# Patient Record
Sex: Female | Born: 1955 | Race: White | Hispanic: No | Smoking: Former smoker
Health system: Southern US, Community
[De-identification: ages and names within clinical notes are randomized; demographics above are authoritative.]

## PROBLEM LIST (undated history)

## (undated) DIAGNOSIS — E7800 Pure hypercholesterolemia, unspecified: Secondary | ICD-10-CM

## (undated) DIAGNOSIS — E119 Type 2 diabetes mellitus without complications: Secondary | ICD-10-CM

## (undated) DIAGNOSIS — F32A Depression, unspecified: Secondary | ICD-10-CM

## (undated) DIAGNOSIS — K219 Gastro-esophageal reflux disease without esophagitis: Secondary | ICD-10-CM

## (undated) DIAGNOSIS — K589 Irritable bowel syndrome without diarrhea: Secondary | ICD-10-CM

## (undated) DIAGNOSIS — I10 Essential (primary) hypertension: Secondary | ICD-10-CM

## (undated) DIAGNOSIS — R0602 Shortness of breath: Secondary | ICD-10-CM

## (undated) DIAGNOSIS — Z9889 Other specified postprocedural states: Secondary | ICD-10-CM

## (undated) DIAGNOSIS — E039 Hypothyroidism, unspecified: Secondary | ICD-10-CM

## (undated) DIAGNOSIS — G43909 Migraine, unspecified, not intractable, without status migrainosus: Secondary | ICD-10-CM

## (undated) DIAGNOSIS — R197 Diarrhea, unspecified: Secondary | ICD-10-CM

## (undated) DIAGNOSIS — G9332 Myalgic encephalomyelitis/chronic fatigue syndrome: Secondary | ICD-10-CM

## (undated) DIAGNOSIS — K5790 Diverticulosis of intestine, part unspecified, without perforation or abscess without bleeding: Secondary | ICD-10-CM

## (undated) DIAGNOSIS — M797 Fibromyalgia: Secondary | ICD-10-CM

## (undated) DIAGNOSIS — R5382 Chronic fatigue, unspecified: Secondary | ICD-10-CM

## (undated) DIAGNOSIS — D126 Benign neoplasm of colon, unspecified: Secondary | ICD-10-CM

## (undated) HISTORY — PX: ELBOW SURGERY: SHX618

## (undated) HISTORY — PX: CARPAL TUNNEL RELEASE: SHX101

## (undated) HISTORY — PX: TRIGGER FINGER RELEASE: SHX641

## (undated) HISTORY — PX: RHINOPLASTY: SUR1284

## (undated) HISTORY — DX: Gastro-esophageal reflux disease without esophagitis: K21.9

## (undated) HISTORY — DX: Chronic fatigue, unspecified: R53.82

## (undated) HISTORY — DX: Myalgic encephalomyelitis/chronic fatigue syndrome: G93.32

## (undated) HISTORY — DX: Diverticulosis of intestine, part unspecified, without perforation or abscess without bleeding: K57.90

## (undated) HISTORY — DX: Benign neoplasm of colon, unspecified: D12.6

## (undated) HISTORY — DX: Pure hypercholesterolemia, unspecified: E78.00

## (undated) HISTORY — PX: OTHER SURGICAL HISTORY: SHX169

## (undated) HISTORY — PX: HERNIA REPAIR: SHX51

## (undated) HISTORY — DX: Other specified postprocedural states: Z98.890

## (undated) HISTORY — PX: CHOLECYSTECTOMY: SHX55

## (undated) HISTORY — PX: TUBAL LIGATION: SHX77

## (undated) HISTORY — PX: ABDOMINAL HYSTERECTOMY: SHX81

## (undated) HISTORY — DX: Hypothyroidism, unspecified: E03.9

## (undated) HISTORY — DX: Essential (primary) hypertension: I10

## (undated) HISTORY — PX: TONSILLECTOMY: SUR1361

## (undated) HISTORY — DX: Irritable bowel syndrome, unspecified: K58.9

---

## 2007-08-10 ENCOUNTER — Ambulatory Visit: Payer: Self-pay | Admitting: Internal Medicine

## 2007-08-16 ENCOUNTER — Ambulatory Visit: Payer: Self-pay | Admitting: Internal Medicine

## 2007-08-16 ENCOUNTER — Ambulatory Visit (HOSPITAL_COMMUNITY): Admission: RE | Admit: 2007-08-16 | Discharge: 2007-08-16 | Payer: Self-pay | Admitting: Internal Medicine

## 2007-08-16 ENCOUNTER — Encounter: Payer: Self-pay | Admitting: Internal Medicine

## 2007-09-19 ENCOUNTER — Ambulatory Visit: Payer: Self-pay | Admitting: Internal Medicine

## 2008-05-01 DIAGNOSIS — R197 Diarrhea, unspecified: Secondary | ICD-10-CM | POA: Insufficient documentation

## 2008-05-01 DIAGNOSIS — K219 Gastro-esophageal reflux disease without esophagitis: Secondary | ICD-10-CM | POA: Insufficient documentation

## 2008-05-01 DIAGNOSIS — K2970 Gastritis, unspecified, without bleeding: Secondary | ICD-10-CM | POA: Insufficient documentation

## 2008-05-01 DIAGNOSIS — R7309 Other abnormal glucose: Secondary | ICD-10-CM

## 2008-05-01 DIAGNOSIS — R12 Heartburn: Secondary | ICD-10-CM

## 2008-05-01 DIAGNOSIS — E7800 Pure hypercholesterolemia, unspecified: Secondary | ICD-10-CM

## 2008-05-01 DIAGNOSIS — E039 Hypothyroidism, unspecified: Secondary | ICD-10-CM | POA: Insufficient documentation

## 2008-05-01 DIAGNOSIS — K2990 Gastroduodenitis, unspecified, without bleeding: Secondary | ICD-10-CM

## 2008-05-01 DIAGNOSIS — R634 Abnormal weight loss: Secondary | ICD-10-CM

## 2008-05-01 DIAGNOSIS — R5382 Chronic fatigue, unspecified: Secondary | ICD-10-CM

## 2008-05-01 DIAGNOSIS — R630 Anorexia: Secondary | ICD-10-CM | POA: Insufficient documentation

## 2008-05-01 DIAGNOSIS — K589 Irritable bowel syndrome without diarrhea: Secondary | ICD-10-CM

## 2008-05-01 DIAGNOSIS — I10 Essential (primary) hypertension: Secondary | ICD-10-CM | POA: Insufficient documentation

## 2009-02-27 ENCOUNTER — Telehealth (INDEPENDENT_AMBULATORY_CARE_PROVIDER_SITE_OTHER): Payer: Self-pay

## 2010-12-15 NOTE — Assessment & Plan Note (Signed)
Michelle Rich, Michelle Rich                  CHART#:  [id number]   DATE:  09/19/2007                       DOB:  [birth year]   FOLLOWUP:  Diarrhea, weight loss.   LAST SEEN:  08/16/2007 by me for three month history of nonbloody  diarrhea weight loss.  Recent colonoscopy by Dr. Marlene Bast was reportedly  negative.  EGD demonstrated intact Nissen fundoplication.  Otherwise  normal esophagus, stomach.  D1 through D3 biopsies DT, D3 negative for  any mucosa abnormality.  TSH was found to be markedly depressed at this  office (0.009).  Her Synthroid dose has been decreased and she tells me  she is overall feeling much better.  She is up 1/2 pound from  08/11/2007.  Diarrhea has resolved.  Reflux symptoms well controlled on  the Zegerid.  She is very pleased.   CURRENT MEDICATIONS:  See updated list.   ALLERGIES:  Darvocet, codeine, penicillin.   EXAMINATION:  Today she looks well.  Weight 201.  Height 5 feet 6  inches.  Temperature 98.2.  Blood pressure 130/98, pulse 80.  SKIN:  Warm and dry.  ABDOMEN:  Nondistended.  Soft, nontender without appreciable mass or  hepatosplenomegaly.   ASSESSMENT:  Diarrhea, anorexia, weight loss in the setting of excessive  thyroid supplementation.  Now much better with a decreased dose.  Her GI  symptoms have settled down.  Her reflux symptoms continue to be well  controlled on omeprazole.   RECOMMENDATIONS:  Continue omeprazole 20 mg early daily.  One year  prescription refill given to the patient today.  Unless something comes  up, we will plan to see her back in the office in one year.   ADDENDUM:  Giardia antigen testing of small bowel aspirate also came  back negative.      Jonathon Bellows, M.D.  Electronically Signed    RMR/MEDQ  D:  09/19/2007  T:  09/19/2007  Job:  [id number]   cc:   Lia Hopping

## 2010-12-15 NOTE — Op Note (Signed)
NAME:  Michelle Rich, Michelle Rich                 ACCOUNT NO.:  [id number]   MEDICAL RECORD NO.:  [id number]          PATIENT TYPE:  AMB   LOCATION:  DAY                           FACILITY:  [facility]   PHYSICIAN:  R. Roetta Sessions, M.D. DATE OF BIRTH:  1956/03/04   DATE OF PROCEDURE:  08/16/2007  DATE OF DISCHARGE:                               OPERATIVE REPORT   PROCEDURE:  EGD with small bowel biopsy aspirate.   INDICATIONS FOR PROCEDURE:  A 55 year old lady with a nearly 3 month  history of intermittent but seriously progressive nonbloody diarrhea.  She tells me she lost 30 pounds in the past 10 weeks. She has a history  of hypothyroidism and is on supplementation.  She saw Dr. Marlene Bast last  month who performed her colonoscopy and cannot find any abnormalities.  She had a negative set of stool studies.  Apparently the colon was not  biopsied to rule out microscopic colitis nor was the ileum seen by  report. There was no family history of inflammatory bowel disease or  colorectal neoplasia or celiac disease for that matter.  EGD is now  being done to further evaluate her weight loss, intermittent nausea and  diarrhea. This approach has been discussed with the patient at length.  The potential risks, benefits and have been reviewed, questions answered  she is agreeable. Please see the documentation in the medical record.   MONITORING:  O2 saturation, blood pressure, pulse and respirations were  monitored  throughout the entire procedure.   CONSCIOUS SEDATION:  Versed 7 mg IV, Demerol 175 mg IV in divided doses.  Phenergan 25 mg diluted slow IV push in divided doses to augment  conscious sedation.  Cetacaine spray for topical pharyngeal anesthesia.   INSTRUMENT:  Pentax video chip system.   FINDINGS:  Examination of the tubular esophagus revealed no mucosal  abnormalities.  EG junction easily traversed.   STOMACH:  The gastric cavity was emptied and insufflated well with air.  A thorough  examination of the gastric mucosa including a retroflexed  view of the proximal stomach and esophagogastric junction demonstrated  intact wrap and otherwise normal-appearing gastric mucosa.  The pylorus  patent and easily traversed.   Examination of the bulb, second and third portion revealed a normal-  appearing mucosa. Biopsies of D2 and D3 were taken for histologic study  and a small bowel aspirate was taken for Giardia antigen screen. The  patient tolerated the procedure well and was reacted in endoscopy.   IMPRESSION:  1. Normal esophagus.  2. Intact Nissen fundoplication.  3. Normal gastric mucosa.  4. Patent pylorus normal.  5. Normal D1 through D3 status post biopsy D2, D3 and small bowel      aspirate for Giardia antigen testing.   [name] TSH is markedly depressed at 0.009 on thyroid  supplementation.  She may be hyperthyroid to account for diarrhea and  recent weight loss. Her CBC through our office came back entirely  normal.  Her Met 7 looked okay except for a slightly elevated glucose of  112.  Her LFTs were normal.  RECOMMENDATIONS:  1. Will follow up on small biopsy checking for celiac disease and      Giardia antigen testing.  2. Mr. Michelle Rich is urged to urgently followup with Dr. Olena Leatherwood so he can      deal with the depressed TSH.  3. If the small bowel biopsy and aspirate are negative and her GI      symptoms do not correct with intervention of her thyroid      dysfunction,  further evaluation of her diarrhea and weight loss      will be in order. At that point, I suppose occult Crohn's disease,      microscopic colitis as well as other diagnostic possibilities will      need to be contemplated. Further recommendations to follow.      Jonathon Bellows, M.D.  Electronically Signed     RMR/MEDQ  D:  08/16/2007  T:  08/16/2007  Job:  [id number]   cc:   Lia Hopping  Fax: [phone]   Benedetto Coons, MD

## 2010-12-15 NOTE — Consult Note (Signed)
NAME:  [name], [name]                 ACCOUNT NO.:  [id number]   MEDICAL RECORD NO.:  [id number]          PATIENT TYPE:  AMB   LOCATION:  DAY                           FACILITY:  [facility]   PHYSICIAN:  R. Roetta Sessions, M.D. DATE OF BIRTH:  02-24-1956   DATE OF CONSULTATION:  08/11/2007  DATE OF DISCHARGE:                                 CONSULTATION   REASON FOR CONSULTATION:  Multiple GI complaints.   HISTORY OF PRESENT ILLNESS:  [name] is a 55 year old female.  She  tells me she developed diarrhea 2 months ago as well as nausea and  vomiting.  In retrospect, she has had intermittent diarrhea for almost a  year now.  However, last 2 months it has been on a daily basis.  She  states her symptoms began as what she thought was gastroenteritis.  She  denies any abdominal pain with her symptoms.  She has lost 30 pounds in  the last 10 weeks she denies anorexia.  She has had intermittent  hematochezia and noticed small amounts of red blood and watery stool.  Generally, she has a bowel movement every time she eats.  She can have  loose stools anywhere from 3-10 times per day.  She has tried Imodium  which does not seem to help.  She is drinking Gatorade at least four per  day.  She has history of hypothyroidism.  A TSH was last checked 8  months ago.  She has tried Bentyl without any help.  She denies any new  medications.  She has tried Imodium which does not seem to help reduce  either.   She has a colonoscopy by Dr. Gabriel Cirri on July 11, 2007, which was  normal.  She had stool studies on July 11, 2007, including a culture,  WBCs and C. difficile of which were negative.   PAST MEDICAL/SURGICAL HISTORY:  1. Irritable bowel syndrome.  2. Chronic fatigue syndrome.  3. Hypertension.  4. Hypothyroidism.  5. Hypercholesterolemia.  6. Chronic GERD status post Nissen fundoplication in 1998.  7. She had she has recently been evaluated by neurologist for frequent      blackouts.  8.  Complete hysterectomy.  9. Umbilical herniorrhaphy.  10.Carpal tunnel release bilaterally.  11.She has glucose intolerance/borderline diabetic.  12.Right elbow surgery.  13.Tonsillectomy.  14.Tubal ligation.  15.Rhinoplasty.  16.Cholecystectomy.   CURRENT MEDICATIONS:  1. Levothyroxine 112 mcg daily.  2. Ergocalciferol 50,000 mg weekly.  3. Hyzaar 50/25 mg daily.  4. Premarin 1.25 mg daily.  5. Primidone 50 mcg three times a day.  6. Omeprazole 20 mg daily.  7. Zocor 20 mg daily.  8. Zanaflex 20 mg t.i.d.  9. Provigil 200 mg daily.  10.Stavzor 250 mg b.i.d. p.r.n. headaches.  11.Lovaza caps four daily.  12.Vitamin D 2 grams daily.  13.Vitamin B12 2 grams daily.  14.Valium 5 mg t.i.d.  15.Ventolin inhaler p.r.n.   ALLERGIES:  CODEINE AND PENICILLIN.   FAMILY HISTORY:  Positive for maternal grandfather with colon cancer.  No first degree relatives.  Mother age 87 history of hypertension.  Father age  39 has coronary disease, one healthy brother, one deceased  due to accident.   SOCIAL HISTORY:  Michelle Rich is separated.  She lives with her mother.  She has one healthy daughter and one healthy son.  She is disabled.  She  has a 35 pack-year history tobacco use.  Denies alcohol or drug use.   REVIEW OF SYSTEMS:  See HPI.  NEUROLOGICAL:  She has had frequent  blackouts, has poorly had a negative neuro evaluation thus far,  including hospitalization.  Otherwise, negative review of systems.   PHYSICAL EXAMINATION:  VITAL SIGNS:  Weight 200.5 pounds, height 56  inches, temperature 98.2, blood pressure 158/84, pulse 64.  GENERAL:  [name] is a well-developed, well-nourished female in no  acute distress.  HEENT.  Sclerae clear, nonicteric.  Conjunctivae pink.  Oropharynx pink  and moist without lesions.  NECK:  Supple without mass or thyromegaly.  CHEST:  Heart regular rate and rhythm with normal S1-S2 without murmurs,  clicks, rubs or gallops.  LUNGS:  Clear to auscultation  bilaterally.  ABDOMEN:  Positive bowel sounds x4.  No bruits auscultated.  Soft,  nontender, nondistended without palpable mass or hepatosplenomegaly.  No  guarding.  EXTREMITIES:  Without clubbing or edema bilaterally.  SKIN:  Pink, warm, dry without any rash or jaundice.   IMPRESSION:  Michelle Rich is a 55 year old female with multiple  gastrointestinal concerns today.  She has had a 25-month history of daily  diarrhea 3-5 loose stools which began nonbloody.  She has noticed some  red bleeding in her stools since that time she did have a colonoscopy by  Dr. Gabriel Cirri on July 11, 2007, which was completely normal.  Since the  onset of diarrhea.  She has had nausea and a sensation of vomiting but  has not been able to vomit since her Nissen fundoplication in 1998.  She  has had a 30-pound weight loss in the last 10 weeks.  Etiology of her  symptoms are unclear at this time.  Further evaluation of nausea and  weight loss to rule out peptic ulcer disease as far as her diarrhea is  concerned.  It is reassuring she has had a normal colonoscopy.  However,  cannot completely rule out inflammatory bowel disease given the new  onset of intermittent hematochezia.   PLAN:  1. Levsin 0.125 mg one p.o. a.c. and h.s. p.r.n. diarrhea #60 with the      refills.  2. CBC, LFTs, met-7 and TSH.  3. EGD with Dr. Jena Gauss in the near future.  I have discussed      procedures, risks, benefits which include but are not limited to      infection, perforation, drug reaction.  She agrees with this plan      and consent will be obtained.   I would like to thank Dr. Gabriel Cirri for allowing [place] to participate in the  care of Ms. Jennette.      Lorenza Burton, N.P.      Jonathon Bellows, M.D.  Electronically Signed    KJ/MEDQ  D:  08/11/2007  T:  08/11/2007  Job:  [id number]   cc:   Erskine Speed, M.D.  Fax: [phone]

## 2011-02-15 ENCOUNTER — Ambulatory Visit: Payer: Self-pay | Admitting: Gastroenterology

## 2011-02-24 ENCOUNTER — Encounter: Payer: Self-pay | Admitting: Gastroenterology

## 2011-02-24 ENCOUNTER — Ambulatory Visit (INDEPENDENT_AMBULATORY_CARE_PROVIDER_SITE_OTHER): Payer: Medicare Other | Admitting: Gastroenterology

## 2011-02-24 VITALS — BP 141/87 | HR 104 | Temp 98.0°F | Ht 65.0 in | Wt 221.0 lb

## 2011-02-24 DIAGNOSIS — R197 Diarrhea, unspecified: Secondary | ICD-10-CM

## 2011-02-24 NOTE — Progress Notes (Signed)
Primary Care Physician:  ADDIS,DANIEL, DO Primary Gastroenterologist:  Dr. Jena Gauss  Chief Complaint  Patient presents with  . Abdominal Pain    HPI:   [name] is a 55 year old Caucasian female who presents after admission to [facility] for diarrhea and abdominal pain. She last saw our office in 2009. Hx of chronic diarrhea. The last procedure from our office was an endoscopy, which was normal and normal small bowel biopsies. Recently had a colonoscopy June 2012 by Dr. Linna Darner at [facility]. Normal colonoscopy, with biopsies showing focal active colitis. Discharge papers reported presuming microscopic colitis; she was sent home on entocort. Stool cultures with yeast and staph aureus. Given Vanc 500 q 6 for 10 days and mycostatin QID X 10 days and probiotic. Was told to wean off entocort, but she has not. Abx completed. Cdiff negative inpatient. CT with findings of mild thickening at splenic flexure and along descending colon.question of pancreatic insufficiency, old trauma, or hx pancreatitis.    Returns today complaining of posptprandial urgency, regardless of food. Up to 12 stools per day. No melena or brbpr. Complains of diffuse and right-sided abdominal discomfort. Bloated and swollen. Described as a bad "tummy ache". Not relieved after BM. Intermitent. +chills but no fever. +loss of appetite. No NSAIDs.     Admitting Hgb 13.1. Cdiff negative.  As of note, after a lengthy review of chart, has been tested for celiac via blood test and biopsy, both negative. Low TSH in past which was corrected with decrease in loose stools.   Past Medical History  Diagnosis Date  . IBS (irritable bowel syndrome)   . Chronic fatigue syndrome   . Hypertension   . Hypothyroidism   . Hypercholesteremia   . GERD (gastroesophageal reflux disease)     s/p Nissen  . S/P endoscopy 2009    normal, small bowel biopsy negative, giardia negative, celiac negative  . S/P colonoscopy     2007: internal/external  hemorrhoids, otherwise normal by Dr. Murlean Hark. June 2012:normal but biopsy with focal acute colitis, non-specific by Dr. Linna Darner     Past Surgical History  Procedure Date  . 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  Marland Kitchen Umbilical herniorrhapy   . Carpal tunnel release   . Elbow surgery     right  . Tonsillectomy   . Tubal ligation   . Rhinoplasty   . Cholecystectomy   . Steel rods and buttons for abdominal wound   . Trigger finger release   . Nissen     Current Outpatient Prescriptions  Medication Sig Dispense Refill  . ALPRAZolam (XANAX) 0.5 MG tablet Take 0.5 mg by mouth at bedtime as needed.        . BUDESONIDE PO Take 9 mg by mouth.        . Butalbital-APAP-Caffeine (FIORICET PO) Take by mouth. 50/325 mg        . Cyanocobalamin (VITAMIN B 12 PO) Take 2 g by mouth.        . dextroamphetamine (DEXEDRINE SPANSULE) 15 MG 24 hr capsule Take 15 mg by mouth daily.        Marland Kitchen eletriptan (RELPAX) 40 MG tablet One tablet by mouth as needed for migraine headache.  If the headache improves and then returns, dose may be repeated after 2 hours have elapsed since first dose (do not exceed 80 mg per day). may repeat in 2 hours if necessary       . estrogens,  conjugated, (PREMARIN) 1.25 MG tablet Take 1.25 mg by mouth daily.        Marland Kitchen FLUoxetine (PROZAC) 20 MG capsule Take 20 mg by mouth daily.        . Fluticasone-Salmeterol (ADVAIR DISKUS) 250-50 MCG/DOSE AEPB Inhale 1 puff into the lungs every 12 (twelve) hours.        Marland Kitchen levothyroxine (SYNTHROID, LEVOTHROID) 112 MCG tablet Take 112 mcg by mouth daily.        Marland Kitchen omega-3 acid ethyl esters (LOVAZA) 1 G capsule Take 2 g by mouth 2 (two) times daily.        Marland Kitchen omeprazole (PRILOSEC) 20 MG capsule Take 20 mg by mouth daily.        . promethazine (PHENERGAN) 50 MG tablet Take 50 mg by mouth every 6 (six) hours as needed.        . simvastatin (ZOCOR) 20 MG tablet Take 20 mg by mouth at bedtime.        Marland Kitchen  telmisartan-hydrochlorothiazide (MICARDIS HCT) 80-12.5 MG per tablet Take 1 tablet by mouth daily.        . tizanidine (ZANAFLEX) 2 MG capsule Take 2 mg by mouth 3 (three) times daily.        . diazepam (VALIUM) 5 MG tablet Take 5 mg by mouth every 6 (six) hours as needed.        Marland Kitchen losartan-hydrochlorothiazide (HYZAAR) 50-12.5 MG per tablet Take 1 tablet by mouth daily.        . modafinil (PROVIGIL) 200 MG tablet Take 200 mg by mouth daily.        . primidone (MYSOLINE) 50 MG tablet Take 50 mg by mouth 4 (four) times daily.        Marland Kitchen VITAMIN D, CHOLECALCIFEROL, PO Take 2 g by mouth.          Allergies as of 02/24/2011 - Review Complete 02/24/2011  Allergen Reaction Noted  . Amitriptyline  02/24/2011  . Aspirin  02/24/2011  . Codeine    . Morphine and related  02/24/2011  . Penicillins    . Propoxyphene n-acetaminophen    . Sulfa antibiotics  02/24/2011    Family History  Problem Relation Age of Onset  . Colon cancer Maternal Grandfather     deceased age 64  . Ulcerative colitis Father     ? question of UC    History   Social History  . Marital Status: Legally Separated    Spouse Name: N/A    Number of Children: N/A  . Years of Education: N/A   Occupational History  . Not on file.   Social History Main Topics  . Smoking status: Former Smoker -- 1.0 packs/day    Types: Cigarettes  . Smokeless tobacco: Not on file   Comment: quit 3 years ago  . Alcohol Use: No  . Drug Use: No  . Sexually Active: Not on file    Review of Systems: Gen: Denies any fever, chills, fatigue, weight loss, + lack of appetite.  CV: Denies chest pain, heart palpitations, peripheral edema, syncope.  Resp: Denies shortness of breath at rest or with exertion. Denies wheezing or cough.  GI: Denies dysphagia or odynophagia. Denies jaundice, hematemesis, fecal incontinence. GU : Denies urinary burning, urinary frequency, urinary hesitancy MS: Denies joint pain, muscle weakness, cramps, or limitation  of movement.  Derm: Denies rash, itching, dry skin Psych: Denies depression, anxiety, memory loss, and confusion Heme: Denies bruising, bleeding, and enlarged lymph nodes.  Physical Exam: BP  141/87  Pulse 104  Temp(Src) 98 F (36.7 C) (Temporal)  Ht 5\' 5"  (1.651 m)  Wt 221 lb (100.245 kg)  BMI 36.78 kg/m2 General:   Alert and oriented. Pleasant and cooperative. Well-nourished and well-developed.  Head:  Normocephalic and atraumatic. Eyes:  Without icterus, sclera clear and conjunctiva pink.  Ears:  Normal auditory acuity. Nose:  No deformity, discharge,  or lesions. Mouth:  No deformity or lesions, oral mucosa pink.  Neck:  Supple, without mass or thyromegaly. Lungs:  Clear to auscultation bilaterally. No wheezes, rales, or rhonchi. No distress.  Heart:  S1, S2 present without murmurs appreciated.  Abdomen:  +BS, soft,TTP right-sided abdomen. Non-distended. . No HSM noted. No guarding or rebound. No masses appreciated.  Rectal:  Deferred  Msk:  Symmetrical without gross deformities. Normal posture. Pulses:  Normal pulses noted. Extremities:  Without clubbing or edema. Neurologic:  Alert and  oriented x4;  grossly normal neurologically. Skin:  Intact without significant lesions or rashes. Cervical Nodes:  No significant cervical adenopathy. Psych:  Alert and cooperative. Normal mood and affect.

## 2011-02-24 NOTE — Patient Instructions (Addendum)
Please complete stool studies. We will contact you as soon as the results are available.  In the meantime, we will be reviewing the CT films with the radiologist.  Avoid dairy products for now. Add a probiotic.   Taper off the entocort. Start taking 6 mg daily for one week, then 3 mg for one week, then stop.   We will be contacting you once results are returned with the next step.

## 2011-02-27 ENCOUNTER — Encounter: Payer: Self-pay | Admitting: Gastroenterology

## 2011-02-27 ENCOUNTER — Other Ambulatory Visit: Payer: Self-pay | Admitting: Gastroenterology

## 2011-02-27 NOTE — Assessment & Plan Note (Addendum)
56 year old Caucasian female with hx of IBS, chronic diarrhea, work-up in past to include colonoscopy at outside facility as well as EGD by [place] in 2009 with small bowel biopsy. Recently discharged from [facility] with presumed microscopic colitis, no improvement with entocort. Biopsies from most recent colonoscopy June 2012 returned non-specific focal active colitis, staph aureus in culture and large amount of yeast. Abx completed. Still on entocort. No melena or brbpr. Cdiff negative. CT with question of pancreatic insufficiency, old trauma, or hx pancreatitis. Pt denies any pancreatic issues in past. +right-sided and diffuse abdominal discomfort, bloating, unrelieved by BM. At times up to 12 stools per day. Question of underlying IBD brewing, infectious etiology, ?pancreatic insufficiency. Pt may very well need a colonoscopy or at very least a flex sig through our own office, as per records we have not personally performed one here. Will proceed with stool studies, review CT with radiologist, taper off entocort, and decide on possible colonoscopy or other imaging studies if above negative.  Taper off entocort. Cdiff PCR, stool culture, quantitative fecal fat  TSH  Probiotic Further recommendations to follow

## 2011-03-01 ENCOUNTER — Other Ambulatory Visit: Payer: Self-pay | Admitting: Gastroenterology

## 2011-03-01 NOTE — Progress Notes (Signed)
Cc to PCP 

## 2011-03-03 ENCOUNTER — Other Ambulatory Visit: Payer: Self-pay | Admitting: Gastroenterology

## 2011-03-03 DIAGNOSIS — R197 Diarrhea, unspecified: Secondary | ICD-10-CM

## 2011-03-04 NOTE — Progress Notes (Signed)
Quick Note:  Negative. Waiting on further stool studies. Can we obtain PR from pt. ______

## 2011-03-05 LAB — STOOL CULTURE

## 2011-03-23 ENCOUNTER — Telehealth: Payer: Self-pay | Admitting: Gastroenterology

## 2011-03-23 MED ORDER — PANTOPRAZOLE SODIUM 40 MG PO TBEC: 40.0000 mg | DELAYED_RELEASE_TABLET | Freq: Every day | ORAL | Status: DC

## 2011-03-23 MED ORDER — PANCRELIPASE (LIP-PROT-AMYL) 24000-76000 UNITS PO CPEP: 2.0000 | ORAL_CAPSULE | Freq: Three times a day (TID) | ORAL | Status: DC

## 2011-03-23 NOTE — Telephone Encounter (Signed)
Pt called and was informed. Rx's were sent to [facility] in [place].

## 2011-03-23 NOTE — Telephone Encounter (Signed)
Please inform pt we have received results from lab regarding fecal fat. This shows a large amount in her stool. At this point, she has not shown improvement with entocort (prescribed by Dr. Linna Darner in June), and her CT showed findings of possible pancreatic insufficiency (at this point unknown etiology).  Let's trial Creon with her. Start with 2 tablets prior to meals, 1 prior to snacks, return in 1 month. Switch from omeprazole to  Protonix 40 mg po daily.   Please ask her if she is having foul smelling stools, slimy/greasy/floating.  We will see her back in 4 weeks.

## 2011-03-24 MED ORDER — PANCRELIPASE (LIP-PROT-AMYL) 25000 UNITS PO CPEP: 2.0000 | ORAL_CAPSULE | Freq: Three times a day (TID) | ORAL | Status: DC

## 2011-03-24 NOTE — Telephone Encounter (Signed)
Creon not covered by insurance. We will switch to Zenpep. Please inform pt this has been sent to her pharmacy.

## 2011-03-24 NOTE — Telephone Encounter (Signed)
Pt aware.

## 2011-03-30 ENCOUNTER — Encounter: Payer: Self-pay | Admitting: Internal Medicine

## 2011-03-30 NOTE — Telephone Encounter (Signed)
Pt is aware of OV on 04/29/11 at 2 pm with AS

## 2011-04-22 ENCOUNTER — Encounter: Payer: Self-pay | Admitting: Gastroenterology

## 2011-04-22 ENCOUNTER — Ambulatory Visit (INDEPENDENT_AMBULATORY_CARE_PROVIDER_SITE_OTHER): Payer: Medicare Other | Admitting: Gastroenterology

## 2011-04-22 DIAGNOSIS — R109 Unspecified abdominal pain: Secondary | ICD-10-CM

## 2011-04-22 DIAGNOSIS — R197 Diarrhea, unspecified: Secondary | ICD-10-CM

## 2011-04-22 LAB — LIPASE: Lipase: 19 U/L (ref 0–75)

## 2011-04-22 LAB — GIARDIA/CRYPTOSPORIDIUM SCREEN(EIA): Giardia Screen - EIA: NEGATIVE

## 2011-04-22 LAB — CBC WITH DIFFERENTIAL/PLATELET
Basophils Relative: 0 % (ref 0–1)
Eosinophils Absolute: 0.1 10*3/uL (ref 0.0–0.7)
Eosinophils Relative: 1 % (ref 0–5)
Hemoglobin: 12.6 g/dL (ref 12.0–15.0)
Lymphs Abs: 4.5 10*3/uL — ABNORMAL HIGH (ref 0.7–4.0)
MCH: 28.7 pg (ref 26.0–34.0)
MCHC: 33.1 g/dL (ref 30.0–36.0)
MCV: 86.8 fL (ref 78.0–100.0)
Monocytes Relative: 6 % (ref 3–12)
RBC: 4.39 MIL/uL (ref 3.87–5.11)

## 2011-04-22 LAB — COMPREHENSIVE METABOLIC PANEL
Alkaline Phosphatase: 83 U/L (ref 39–117)
BUN: 15 mg/dL (ref 6–23)
CO2: 26 mEq/L (ref 19–32)
Creat: 1.24 mg/dL — ABNORMAL HIGH (ref 0.50–1.10)
Glucose, Bld: 110 mg/dL — ABNORMAL HIGH (ref 70–99)
Total Bilirubin: 0.3 mg/dL (ref 0.3–1.2)

## 2011-04-22 MED ORDER — PROMETHAZINE HCL 50 MG PO TABS: 25.0000 mg | ORAL_TABLET | Freq: Four times a day (QID) | ORAL | Status: DC | PRN

## 2011-04-22 MED ORDER — PANCRELIPASE (LIP-PROT-AMYL) 25000 UNITS PO CPEP: 3.0000 | ORAL_CAPSULE | Freq: Three times a day (TID) | ORAL | Status: DC

## 2011-04-22 NOTE — Patient Instructions (Signed)
Start taking Aciphex by mouth each morning. Samples have been provided.  Please increase your enzymes to 3 pills with each meal, 1 with snacks. There is room to increase this if needed. Please call our office if you do not notice improvement.  We have set you up for an upper endoscopy with Dr. Jena Gauss in the near future.  Please have blood work drawn; we will call you with these results.

## 2011-04-22 NOTE — Progress Notes (Signed)
Referring Provider: Renaldo Harrison, DO Primary Care Physician:  ADDIS,DANIEL, DO  Chief Complaint  Patient presents with  . Follow-up    meds not working    HPI:   Michelle Rich is a 55 year old Caucasian female who presents in follow-up for chronic diarrhea. She was admitted in June 2012 to [facility] for diarrhea and abdominal pain. The last procedure from our office was an endoscopy, which was normal and normal small bowel biopsies. Recently had a colonoscopy June 2012 by Dr. Linna Darner at [place]. Normal colonoscopy, with biopsies showing focal active colitis. Discharge papers reported presuming microscopic colitis; she was sent home on entocort but was supposed to wean off as microscopic colitis actually not found,. Stool cultures with yeast and staph aureus. Given Vanc 500 q 6 for 10 days and mycostatin QID X 10 days and probiotic. Cdiff negative inpatient. CT with findings of mild thickening at splenic flexure and along descending colon.question of pancreatic insufficiency, old trauma, or hx pancreatitis.  In interim from last visit, completed stool studies and fecal fat analysis. Stool studies negative, fecal fat with elevated at 27 (normal less than 7). Started on Zenpep, low-dose as trial.   Now states every time she eats, she goes to bathroom. Ate a sandwich yesterday, went 4 times afterward. Greasy/slimy/floating. Intermittent brbpr. Tries to burp but can't, slimy taste in mouth, right back pain (scapular area), and right-sided abdomen/diffused abdominal soreness. Described as constant. No relief with BMs. +Nausea, no GERD. Feels "fizzy". Takes Protonix daily. No early satiety. States she is hungry. Up 10 lbs from last visit. Taking a probiotic.   Review of chart at last visit showed she has been tested for celiac via blood test and biopsy in past, both negative. Low TSH in past, corrected with decrease in loose stools. This is the only lab not completed thus far.    Past Medical History    Diagnosis Date  . IBS (irritable bowel syndrome)   . Chronic fatigue syndrome   . Hypertension   . Hypothyroidism   . Hypercholesteremia   . GERD (gastroesophageal reflux disease)     s/p Nissen  . S/P endoscopy 2009    normal, small bowel biopsy negative, giardia negative, celiac negative  . S/P colonoscopy     2007: internal/external hemorrhoids, otherwise normal by Dr. Murlean Hark. June 2012:normal but biopsy with focal acute colitis, non-specific by Dr. Linna Darner     Past Surgical History  Procedure Date  . 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  Marland Kitchen Umbilical herniorrhapy   . Carpal tunnel release   . Elbow surgery     right  . Tonsillectomy   . Tubal ligation   . Rhinoplasty   . Cholecystectomy   . Steel rods and buttons for abdominal wound   . Trigger finger release   . Nissen     Current Outpatient Prescriptions  Medication Sig Dispense Refill  . ALPRAZolam (XANAX) 0.5 MG tablet Take 0.5 mg by mouth at bedtime as needed.        . Butalbital-APAP-Caffeine (FIORICET PO) Take by mouth. 50/325 mg        . Cyanocobalamin (VITAMIN B 12 PO) Take 2 g by mouth.        . dextroamphetamine (DEXEDRINE SPANSULE) 15 MG 24 hr capsule Take 15 mg by mouth daily.        . diazepam (VALIUM) 5 MG tablet Take 5 mg by mouth every 6 (six) hours  as needed.        . eletriptan (RELPAX) 40 MG tablet One tablet by mouth as needed for migraine headache.  If the headache improves and then returns, dose may be repeated after 2 hours have elapsed since first dose (do not exceed 80 mg per day). may repeat in 2 hours if necessary       . estrogens, conjugated, (PREMARIN) 1.25 MG tablet Take 1.25 mg by mouth daily.        Marland Kitchen FLUoxetine (PROZAC) 20 MG capsule Take 20 mg by mouth daily.        . Fluticasone-Salmeterol (ADVAIR DISKUS) 250-50 MCG/DOSE AEPB Inhale 1 puff into the lungs every 12 (twelve) hours.        Marland Kitchen levothyroxine (SYNTHROID, LEVOTHROID) 112  MCG tablet Take 112 mcg by mouth daily.        Marland Kitchen losartan-hydrochlorothiazide (HYZAAR) 50-12.5 MG per tablet Take 1 tablet by mouth daily.        . modafinil (PROVIGIL) 200 MG tablet Take 200 mg by mouth daily.        Marland Kitchen omega-3 acid ethyl esters (LOVAZA) 1 G capsule Take 2 g by mouth 2 (two) times daily.        . Pancrelipase, Lip-Prot-Amyl, (ZENPEP) 25000 UNITS CPEP Take 3 capsules by mouth 3 (three) times daily with meals. 1 with snacks.  330 capsule  3  . pantoprazole (PROTONIX) 40 MG tablet Take 1 tablet (40 mg total) by mouth daily.  31 tablet  1  . primidone (MYSOLINE) 50 MG tablet Take 50 mg by mouth 4 (four) times daily.        . promethazine (PHENERGAN) 50 MG tablet Take 0.5 tablets (25 mg total) by mouth every 6 (six) hours as needed.  31 tablet  0  . simvastatin (ZOCOR) 20 MG tablet Take 20 mg by mouth at bedtime.        Marland Kitchen telmisartan-hydrochlorothiazide (MICARDIS HCT) 80-12.5 MG per tablet Take 1 tablet by mouth daily.        . tizanidine (ZANAFLEX) 2 MG capsule Take 2 mg by mouth 3 (three) times daily.        Marland Kitchen VITAMIN D, CHOLECALCIFEROL, PO Take 2 g by mouth.          Allergies as of 04/22/2011 - Review Complete 04/22/2011  Allergen Reaction Noted  . Amitriptyline  02/24/2011  . Aspirin  02/24/2011  . Codeine    . Morphine and related  02/24/2011  . Penicillins    . Propoxyphene n-acetaminophen    . Sulfa antibiotics  02/24/2011    Family History  Problem Relation Age of Onset  . Colon cancer Maternal Grandfather     deceased age 69  . Ulcerative colitis Father     ? question of UC    History   Social History  . Marital Status: Legally Separated    Spouse Name: N/A    Number of Children: N/A  . Years of Education: N/A   Social History Main Topics  . Smoking status: Former Smoker -- 1.0 packs/day    Types: Cigarettes  . Smokeless tobacco: None   Comment: quit 3 years ago  . Alcohol Use: No  . Drug Use: No  . Sexually Active: None   Other Topics Concern   . None   Social History Narrative  . None    Review of Systems: Gen: Denies fever, chills, anorexia. Denies fatigue, weakness, weight loss.  CV: Denies chest pain, palpitations, syncope, peripheral edema,  and claudication. Resp: Denies dyspnea at rest, cough, wheezing, coughing up blood, and pleurisy. GI: Denies vomiting blood, jaundice, and fecal incontinence.   Denies dysphagia or odynophagia. Derm: Denies rash, itching, dry skin Psych: Denies depression, anxiety, memory loss, confusion. No homicidal or suicidal ideation.  Heme: Denies bruising, bleeding, and enlarged lymph nodes.  Physical Exam: BP 159/99  Pulse 120  Temp(Src) 98.6 F (37 C) (Temporal)  Ht 5\' 5"  (1.651 m)  Wt 231 lb 12.8 oz (105.144 kg)  BMI 38.57 kg/m2 General:   Alert and oriented. No distress noted. Pleasant and cooperative.  Head:  Normocephalic and atraumatic. Eyes:  Conjuctiva clear without scleral icterus. Mouth:  Oral mucosa pink and moist. Good dentition. No lesions. Neck:  Supple, without mass or thyromegaly. Heart:  S1, S2 present without murmurs, rubs, or gallops. Regular rate and rhythm. Abdomen:  +BS, soft,largely obese,  non-tender and non-distended. No rebound or guarding. No HSM or masses noted. Msk:  Symmetrical without gross deformities. Normal posture. Extremities:  Without edema. Neurologic:  Alert and  oriented x4;  grossly normal neurologically. Skin:  Intact without significant lesions or rashes. Cervical Nodes:  No significant cervical adenopathy. Psych:  Alert and cooperative. Normal mood and affect.

## 2011-04-25 DIAGNOSIS — R109 Unspecified abdominal pain: Secondary | ICD-10-CM | POA: Insufficient documentation

## 2011-04-25 NOTE — Assessment & Plan Note (Addendum)
55 year old Caucasian female with right-sided/diffuse abdominal "soreness", with no precipitation or alleviating factors. +Nausea, feels like needs to throw up but can't. Interestingly, she is up 10 lbs from the last visit. S/p chole. On Zenpep now due to high fecal fat analysis. Greasy, loose stools, multiple times per day, especially after eating. Negative biopsy for celiac sprue in past, negative blood test. Labs thus far have been unrevealing. Complains of foul, slimy taste in mouth. Last EGD in 2009. Due to nausea, abdominal pain, will proceed with EGD with Dr. Jena Gauss. Doubt dealing with hepatobiliary etiology: labs thus far have been unrevealing.   Proceed with upper endoscopy in the near future with Dr. Jena Gauss. The risks, benefits, and alternatives have been discussed in detail with patient. They have stated understanding and desire to proceed.  Switch to Aciphex, samples provided #14 Obtain TSH (due to chronic diarrhea) Increase Zenpep to 3 capsules per meal, 1 per snack Consider Questran in future, flex sig if necessary?

## 2011-04-26 NOTE — Progress Notes (Signed)
Cc to PCP 

## 2011-04-26 NOTE — Assessment & Plan Note (Signed)
Hx of chronic diarrhea, with thorough work-up in past. Stool studies negative thus far, colonoscopy negative for microscopic colitis in June 2012 with Dr. Linna Darner. Interestingly enough, she has gained 10 lbs since the last visit. Fecal fat analysis performed, large quantity of fecal fat, started on Zenpep empirically (?pancreatic insufficiency on CT from [facility]). Will increase Zenpep to 3 capsules with meals, one with snacks. This is on the lower end of dosing actually. ?bile salt diarrhea? If no improvement, may need to consider flex sig, questran?. However, await TSH results.

## 2011-04-29 ENCOUNTER — Ambulatory Visit: Payer: Medicare Other | Admitting: Gastroenterology

## 2011-04-29 NOTE — Progress Notes (Signed)
Results Cc to PCP  

## 2011-05-10 MED ORDER — SODIUM CHLORIDE 0.45 % IV SOLN
Freq: Once | INTRAVENOUS | Status: AC
  Administered 2011-05-11: 11:00:00 via INTRAVENOUS

## 2011-05-11 ENCOUNTER — Ambulatory Visit (HOSPITAL_COMMUNITY)
Admission: RE | Admit: 2011-05-11 | Discharge: 2011-05-11 | Disposition: A | Discharge status: Home / Self Care | Payer: Medicare Other | Source: Ambulatory Visit | Attending: Internal Medicine | Admitting: Internal Medicine

## 2011-05-11 ENCOUNTER — Other Ambulatory Visit: Payer: Self-pay | Admitting: Internal Medicine

## 2011-05-11 ENCOUNTER — Encounter (HOSPITAL_COMMUNITY)
Admission: RE | Disposition: A | Discharge status: Home / Self Care | Payer: Self-pay | Source: Ambulatory Visit | Attending: Internal Medicine

## 2011-05-11 ENCOUNTER — Encounter (HOSPITAL_COMMUNITY): Payer: Self-pay | Admitting: *Deleted

## 2011-05-11 DIAGNOSIS — K2100 Gastro-esophageal reflux disease with esophagitis, without bleeding: Secondary | ICD-10-CM | POA: Insufficient documentation

## 2011-05-11 DIAGNOSIS — R109 Unspecified abdominal pain: Secondary | ICD-10-CM

## 2011-05-11 DIAGNOSIS — R197 Diarrhea, unspecified: Secondary | ICD-10-CM | POA: Insufficient documentation

## 2011-05-11 DIAGNOSIS — Z79899 Other long term (current) drug therapy: Secondary | ICD-10-CM | POA: Insufficient documentation

## 2011-05-11 DIAGNOSIS — I10 Essential (primary) hypertension: Secondary | ICD-10-CM | POA: Insufficient documentation

## 2011-05-11 HISTORY — PX: ESOPHAGOGASTRODUODENOSCOPY: SHX5428

## 2011-05-11 HISTORY — DX: Diarrhea, unspecified: R19.7

## 2011-05-11 HISTORY — DX: Migraine, unspecified, not intractable, without status migrainosus: G43.909

## 2011-05-11 HISTORY — DX: Fibromyalgia: M79.7

## 2011-05-11 HISTORY — DX: Shortness of breath: R06.02

## 2011-05-11 SURGERY — EGD (ESOPHAGOGASTRODUODENOSCOPY)
Anesthesia: Moderate Sedation

## 2011-05-11 MED ORDER — PANTOPRAZOLE SODIUM 40 MG PO TBEC: 40.0000 mg | DELAYED_RELEASE_TABLET | Freq: Two times a day (BID) | ORAL | Status: DC

## 2011-05-11 MED ORDER — MIDAZOLAM HCL 5 MG/5ML IJ SOLN
INTRAMUSCULAR | Status: DC | PRN
  Administered 2011-05-11 (×4): 2 mg via INTRAVENOUS

## 2011-05-11 MED ORDER — MIDAZOLAM HCL 5 MG/5ML IJ SOLN
INTRAMUSCULAR | Status: AC
  Filled 2011-05-11: qty 10

## 2011-05-11 MED ORDER — BUTAMBEN-TETRACAINE-BENZOCAINE 2-2-14 % EX AERO
INHALATION_SPRAY | CUTANEOUS | Status: DC | PRN
  Administered 2011-05-11: 2 via TOPICAL

## 2011-05-11 MED ORDER — MEPERIDINE HCL 100 MG/ML IJ SOLN
INTRAMUSCULAR | Status: AC
  Filled 2011-05-11: qty 2

## 2011-05-11 MED ORDER — MEPERIDINE HCL 100 MG/ML IJ SOLN
INTRAMUSCULAR | Status: DC | PRN
  Administered 2011-05-11: 25 mg via INTRAVENOUS
  Administered 2011-05-11 (×3): 50 mg via INTRAVENOUS

## 2011-05-11 NOTE — H&P (Signed)
Michelle Halls, NP  04/26/2011 12:02 AM  Signed   Referring Provider: Renaldo Harrison, DO Primary Care Physician:  Rich,DANIEL, DO    Chief Complaint   Patient presents with   .  Follow-up       meds not working      HPI:    [name] is a 55 year old Caucasian female who presents in follow-up for chronic diarrhea. She was admitted in June 2012 to [facility] for diarrhea and abdominal pain. The last procedure from our office was an endoscopy, which was normal and normal small bowel biopsies. Recently had a colonoscopy June 2012 by Dr. Linna Rich at [place]. Normal colonoscopy, with biopsies showing focal active colitis. Discharge papers reported presuming microscopic colitis; she was sent home on entocort but was supposed to wean off as microscopic colitis actually not found,. Stool cultures with yeast and staph aureus. Given Vanc 500 q 6 for 10 days and mycostatin QID X 10 days and probiotic. Cdiff negative inpatient. CT with findings of mild thickening at splenic flexure and along descending colon.question of pancreatic insufficiency, old trauma, or hx pancreatitis.   In interim from last visit, completed stool studies and fecal fat analysis. Stool studies negative, fecal fat with elevated at 27 (normal less than 7). Started on Zenpep, low-dose as trial.    Now states every time she eats, she goes to bathroom. Ate a sandwich yesterday, went 4 times afterward. Greasy/slimy/floating. Intermittent brbpr. Tries to burp but can't, slimy taste in mouth, right back pain (scapular area), and right-sided abdomen/diffused abdominal soreness. Described as constant. No relief with BMs. +Nausea, no GERD. Feels "fizzy". Takes Protonix daily. No early satiety. States she is hungry. Up 10 lbs from last visit. Taking a probiotic.    Review of chart at last visit showed she has been tested for celiac via blood test and biopsy in past, both negative. Low TSH in past, corrected with decrease in loose stools. This is the  only lab not completed thus far.       Past Medical History   Diagnosis  Date   .  IBS (irritable bowel syndrome)     .  Chronic fatigue syndrome     .  Hypertension     .  Hypothyroidism     .  Hypercholesteremia     .  GERD (gastroesophageal reflux disease)         s/p Nissen   .  S/P endoscopy  2009       normal, small bowel biopsy negative, giardia negative, celiac negative   .  S/P colonoscopy         2007: internal/external hemorrhoids, otherwise normal by Dr. Murlean Hark. June 2012:normal but biopsy with focal acute colitis, non-specific by Dr. Linna Rich        Past Surgical History   Procedure  Date   .  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   Marland Kitchen  Umbilical herniorrhapy     .  Carpal tunnel release     .  Elbow surgery         right   .  Tonsillectomy     .  Tubal ligation     .  Rhinoplasty     .  Cholecystectomy     .  Steel rods and buttons for abdominal wound     .  Trigger finger release     .  Nissen  Current Outpatient Prescriptions   Medication  Sig  Dispense  Refill   .  ALPRAZolam (XANAX) 0.5 MG tablet  Take 0.5 mg by mouth at bedtime as needed.           .  Butalbital-APAP-Caffeine (FIORICET PO)  Take by mouth. 50/325 mg            .  Cyanocobalamin (VITAMIN B 12 PO)  Take 2 g by mouth.           .  dextroamphetamine (DEXEDRINE SPANSULE) 15 MG 24 hr capsule  Take 15 mg by mouth daily.           .  diazepam (VALIUM) 5 MG tablet  Take 5 mg by mouth every 6 (six) hours as needed.           .  eletriptan (RELPAX) 40 MG tablet  One tablet by mouth as needed for migraine headache.  If the headache improves and then returns, dose may be repeated after 2 hours have elapsed since first dose (do not exceed 80 mg per day). may repeat in 2 hours if necessary          .  estrogens, conjugated, (PREMARIN) 1.25 MG tablet  Take 1.25 mg by mouth daily.           Marland Kitchen  FLUoxetine (PROZAC) 20 MG capsule  Take 20 mg by  mouth daily.           .  Fluticasone-Salmeterol (ADVAIR DISKUS) 250-50 MCG/DOSE AEPB  Inhale 1 puff into the lungs every 12 (twelve) hours.           Marland Kitchen  levothyroxine (SYNTHROID, LEVOTHROID) 112 MCG tablet  Take 112 mcg by mouth daily.           Marland Kitchen  losartan-hydrochlorothiazide (HYZAAR) 50-12.5 MG per tablet  Take 1 tablet by mouth daily.           .  modafinil (PROVIGIL) 200 MG tablet  Take 200 mg by mouth daily.           Marland Kitchen  omega-3 acid ethyl esters (LOVAZA) 1 G capsule  Take 2 g by mouth 2 (two) times daily.           .  Pancrelipase, Lip-Prot-Amyl, (ZENPEP) 25000 UNITS CPEP  Take 3 capsules by mouth 3 (three) times daily with meals. 1 with snacks.   330 capsule   3   .  pantoprazole (PROTONIX) 40 MG tablet  Take 1 tablet (40 mg total) by mouth daily.   31 tablet   1   .  primidone (MYSOLINE) 50 MG tablet  Take 50 mg by mouth 4 (four) times daily.           .  promethazine (PHENERGAN) 50 MG tablet  Take 0.5 tablets (25 mg total) by mouth every 6 (six) hours as needed.   31 tablet   0   .  simvastatin (ZOCOR) 20 MG tablet  Take 20 mg by mouth at bedtime.           Marland Kitchen  telmisartan-hydrochlorothiazide (MICARDIS HCT) 80-12.5 MG per tablet  Take 1 tablet by mouth daily.           .  tizanidine (ZANAFLEX) 2 MG capsule  Take 2 mg by mouth 3 (three) times daily.           Marland Kitchen  VITAMIN D, CHOLECALCIFEROL, PO  Take 2 g by mouth.  Allergies as of 04/22/2011 - Review Complete 04/22/2011   Allergen  Reaction  Noted   .  Amitriptyline    02/24/2011   .  Aspirin    02/24/2011   .  Codeine       .  Morphine and related    02/24/2011   .  Penicillins       .  Propoxyphene n-acetaminophen       .  Sulfa antibiotics    02/24/2011       Family History   Problem  Relation  Age of Onset   .  Colon cancer  Maternal Grandfather         deceased age 98   .  Ulcerative colitis  Father         ? question of UC       History       Social History   .  Marital Status:  Legally Separated        Spouse Name:  N/A       Number of Children:  N/A   .  Years of Education:  N/A       Social History Main Topics   .  Smoking status:  Former Smoker -- 1.0 packs/day       Types:  Cigarettes   .  Smokeless tobacco:  None     Comment: quit 3 years ago   .  Alcohol Use:  No   .  Drug Use:  No   .  Sexually Active:  None       Other Topics  Concern   .  None       Social History Narrative   .  None      Review of Systems: Gen: Denies fever, chills, anorexia. Denies fatigue, weakness, weight loss.   CV: Denies chest pain, palpitations, syncope, peripheral edema, and claudication. Resp: Denies dyspnea at rest, cough, wheezing, coughing up blood, and pleurisy. GI: Denies vomiting blood, jaundice, and fecal incontinence.   Denies dysphagia or odynophagia. Derm: Denies rash, itching, dry skin Psych: Denies depression, anxiety, memory loss, confusion. No homicidal or suicidal ideation.   Heme: Denies bruising, bleeding, and enlarged lymph nodes.   Physical Exam: BP 159/99  Pulse 120  Temp(Src) 98.6 F (37 C) (Temporal)  Ht 5\' 5"  (1.651 m)  Wt 231 lb 12.8 oz (105.144 kg)  BMI 38.57 kg/m2 General:   Alert and oriented. No distress noted. Pleasant and cooperative.   Head:  Normocephalic and atraumatic. Eyes:  Conjuctiva clear without scleral icterus. Mouth:  Oral mucosa pink and moist. Good dentition. No lesions. Neck:  Supple, without mass or thyromegaly. Heart:  S1, S2 present without murmurs, rubs, or gallops. Regular rate and rhythm. Abdomen:  +BS, soft,largely obese,  non-tender and non-distended. No rebound or guarding. No HSM or masses noted. Msk:  Symmetrical without gross deformities. Normal posture. Extremities:  Without edema. Neurologic:  Alert and  oriented x4;  grossly normal neurologically. Skin:  Intact without significant lesions or rashes. Cervical Nodes:  No significant cervical adenopathy. Psych:  Alert and cooperative. Normal mood and affect.     [place] Score  04/26/2011  3:47 PM  Signed Cc to PCP  [place] Score  04/29/2011 12:48 PM  Signed Results Cc to PCP        Abdominal pain - Michelle Halls, NP  04/25/2011 11:52 PM  Addendum 55 year old Caucasian female with right-sided/diffuse abdominal "soreness", with no precipitation or alleviating factors. +  Nausea, feels like needs to throw up but can't. Interestingly, she is up 10 lbs from the last visit. S/p chole. On Zenpep now due to high fecal fat analysis. Greasy, loose stools, multiple times per day, especially after eating. Negative biopsy for celiac sprue in past, negative blood test. Labs thus far have been unrevealing. Complains of foul, slimy taste in mouth. Last EGD in 2009. Due to nausea, abdominal pain, will proceed with EGD with Dr. Jena Gauss. Doubt dealing with hepatobiliary etiology: labs thus far have been unrevealing.    Proceed with upper endoscopy in the near future with Dr. Jena Gauss. The risks, benefits, and alternatives have been discussed in detail with patient. They have stated understanding and desire to proceed.   Switch to Aciphex, samples provided #14 Obtain TSH (due to chronic diarrhea) Increase Zenpep to 3 capsules per meal, 1 per snack Consider Questran in future, flex sig if necessary?  Previous Version  DIARRHEA - Michelle Halls, NP  04/26/2011 12:01 AM  Signed Hx of chronic diarrhea, with thorough work-up in past. Stool studies negative thus far, colonoscopy negative for microscopic colitis in June 2012 with Dr. Linna Rich. Interestingly enough, she has gained 10 lbs since the last visit. Fecal fat analysis performed, large quantity of fecal fat, started on Zenpep empirically (?pancreatic insufficiency on CT from [facility]). Will increase Zenpep to 3 capsules with meals, one with snacks. This is on the lower end of dosing actually. ?bile salt diarrhea? If no improvement, may need to consider flex sig, questran?. However, await TSH results.     I have seen the patient prior to the  procedure(s) today and reviewed the history and physical / consultation from 04/26/11.  There have been no changes. After consideration of the risks, benefits, alternatives and imponderables, the patient has consented to the procedure(s).

## 2011-05-17 ENCOUNTER — Encounter: Payer: Self-pay | Admitting: Gastroenterology

## 2011-05-17 ENCOUNTER — Other Ambulatory Visit: Payer: Self-pay

## 2011-05-17 MED ORDER — PROMETHAZINE HCL 25 MG PO TABS: ORAL_TABLET | ORAL | Status: DC

## 2011-05-17 NOTE — Progress Notes (Unsigned)
  How is pt's diarrhea since increasing Zenpep to 3 capsules per meal?  Need TSH results. I am unsure if she had this done or not.

## 2011-05-19 ENCOUNTER — Encounter (HOSPITAL_COMMUNITY): Payer: Self-pay | Admitting: Internal Medicine

## 2011-05-20 NOTE — Progress Notes (Signed)
Ok. We aren't getting anywhere with symptomatic treatment. Wondering if perhaps she has microscopic colitis or other underlying etiology. She did have a colonoscopy with Dr. Linna Darner in July 2012, showing focal active colitis, and they were concerned about microscopic colitis at that time. Path negative however. She really needs to have another look as her diarrhea is worse. In looking back, we have not actually performed a colonoscopy on her; she has had it done by Dr. Gabriel Cirri and Dr. Linna Darner. At this point, I feel we need to do at the very least a flex sig, if not a complete colonoscopy. I'd like to run this by Dr. Jena Gauss first. Please tell pt we will be proceeding with a look at her colon in some form or fashion in the near future. This will need to be done in the OR due to polypharmacy. I will get input from Dr. Jena Gauss and let you know flex sig vs TCS.

## 2011-05-20 NOTE — Progress Notes (Signed)
Called pt- she said her bowel movements are worse, she said they were loose and slimy. Having about 7-8 a day. She has a doctor that she sees about her tyroid, she said he recently checked her thyroid and told her is was ok.

## 2011-05-20 NOTE — Progress Notes (Signed)
Tried to call pt- LMOM 

## 2011-05-24 ENCOUNTER — Other Ambulatory Visit: Payer: Self-pay

## 2011-05-25 ENCOUNTER — Encounter: Payer: Self-pay | Admitting: Internal Medicine

## 2011-06-09 ENCOUNTER — Other Ambulatory Visit: Payer: Self-pay

## 2011-06-09 MED ORDER — PROMETHAZINE HCL 25 MG PO TABS: ORAL_TABLET | ORAL | Status: DC

## 2011-07-21 ENCOUNTER — Other Ambulatory Visit: Payer: Self-pay

## 2011-07-21 ENCOUNTER — Encounter: Payer: Self-pay | Admitting: Gastroenterology

## 2011-07-21 MED ORDER — PROMETHAZINE HCL 25 MG PO TABS: ORAL_TABLET | ORAL | Status: DC

## 2011-07-21 NOTE — Progress Notes (Unsigned)
We need to bring pt in for an office visit to further assess diarrhea. Last documentation in epic with plans for possible flex sig vs TCS. This was not ordered. Unsure reason. Regardless, pt will likely need a flex sig with [place].

## 2011-08-02 ENCOUNTER — Encounter: Payer: Self-pay | Admitting: Gastroenterology

## 2011-08-02 NOTE — Progress Notes (Signed)
appt made for 08/11/11- letter mailed

## 2011-08-11 ENCOUNTER — Encounter: Payer: Self-pay | Admitting: Gastroenterology

## 2011-08-11 ENCOUNTER — Other Ambulatory Visit: Payer: Self-pay | Admitting: Gastroenterology

## 2011-08-11 ENCOUNTER — Ambulatory Visit (INDEPENDENT_AMBULATORY_CARE_PROVIDER_SITE_OTHER): Payer: Medicare Other | Admitting: Gastroenterology

## 2011-08-11 VITALS — BP 157/87 | HR 94 | Temp 98.2°F | Ht 65.0 in | Wt 243.2 lb

## 2011-08-11 DIAGNOSIS — R109 Unspecified abdominal pain: Secondary | ICD-10-CM

## 2011-08-11 DIAGNOSIS — R197 Diarrhea, unspecified: Secondary | ICD-10-CM

## 2011-08-11 DIAGNOSIS — R110 Nausea: Secondary | ICD-10-CM

## 2011-08-11 MED ORDER — PEG 3350-KCL-NA BICARB-NACL 420 G PO SOLR: ORAL | Status: AC

## 2011-08-11 MED ORDER — PROMETHAZINE HCL 25 MG PO TABS: ORAL_TABLET | ORAL | Status: DC

## 2011-08-11 NOTE — Patient Instructions (Signed)
We have set you up for a colonoscopy with Dr. Jena Gauss in the near future, as well as a study to see how well your stomach is emptying.   In the meantime, please review the reflux diet that is provided, and attempt to lose 1-2 lbs of weight each week. Let's shoot for a goal of 10 lbs in the next 2 months. This will help with your reflux.   A refill of the nausea medication has been sent to the pharmacy.  I will review with Dr. Jena Gauss any further testing needed and get back with you.   We will see you back after the colonoscopy.

## 2011-08-11 NOTE — Progress Notes (Signed)
Referring Provider: Renaldo Harrison, DO Primary Care Physician:  ADDIS,DANIEL, DO Primary Gastroenterologist: Dr. Jena Gauss   Chief Complaint  Patient presents with  . Follow-up  . Diarrhea  . Abdominal Pain    HPI:   56 year old female who returns today with hx chronic diarrhea, underwent EGD in interim from last visit Oct 2012 secondary to vague symptoms of right-sided abdominal/diffuse abdominal soreness, nausea, feelings of needing to burp. EGD with reflux esophagitis, biopsy negative for celiac sprue. Protonix increased to BID dosing. As of note, she has history of right-sided abdominal pain in past as well. Was 231 in September now 243. Steadily gaining weight. In July she was 221. Total of 22 lbs gained since July 2012 despite complaints of nausea, chronic diarrhea. As of note, prior stool studies negative. Last colonoscopy performed by Dr. Linna Darner as described below. No prior TCS by [place]. Fecal fat elevated, on Zenpep. CT from [facility] in summer 2012: question of pancreatic insufficiency, old trauma, or hx pancreatitis.   She presents today with somewhat mixed reports. At first stating with Zenpep dosage increased she noted nausea worsening; however with further discussion she admits that nausea is chronic. Even after review of paper chart, appears was dealing with nausea in the past as well. She has noted slight improvement of diarrhea. She takes at least 50 mg of Phenergan daily to BID. Taking Protonix BID.  Reports stools as soft and slimy, about 5 times per day. Usually post-prandial. Right-sided abdominal pain/right scapular pain, reported as constant. Not worsened with eating/drinking. Reflux worsening. Wakes up with it in the morning. Notes an increased appetite. Heaving daily. Tried to stop Zenpep for three weeks and nausea still continued. No worsening of nausea with eating or early satiety.   Labs have been done in interim to include: negative stool studies, quite elevated fecal fat  analysis, lipase normal, LFTs normal, no anemia on CBC, TSH pending from outside source but reportedly normal.   As of note, in the past her diarrhea was a result of decreased TSH. Once this corrected, she was greatly improved.     Past Medical History  Diagnosis Date  . IBS (irritable bowel syndrome)   . Chronic fatigue syndrome   . Hypertension   . Hypothyroidism   . Hypercholesteremia   . GERD (gastroesophageal reflux disease)     s/p Nissen  . S/P endoscopy 2009, 2012    normal, small bowel biopsy negative, giardia negative, celiac negative, 2012: reflux esophagitis, benign bx, negative celiac  . S/P colonoscopy     2007: internal/external hemorrhoids, otherwise normal by Dr. Murlean Hark. June 2012:normal but biopsy with focal acute colitis, non-specific by Dr. Linna Darner   . Asthma   . Shortness of breath   . Migraines   . Fibromyalgia   . Constipation   . Diarrhea     Past Surgical History  Procedure Date  . 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  Marland Kitchen Umbilical herniorrhapy   . Carpal tunnel release   . Elbow surgery     right  . Tonsillectomy   . Tubal ligation   . Rhinoplasty   . Cholecystectomy   . Steel rods and buttons for abdominal wound   . Trigger finger release   . Nissen   . Abdominal hysterectomy   . Hernia repair   . Esophagogastroduodenoscopy 05/11/2011    Procedure: ESOPHAGOGASTRODUODENOSCOPY (EGD);  Surgeon: Corbin Ade, MD;  Location: [facility];  Service: Endoscopy;  Laterality: N/A;  12:30    Current Outpatient Prescriptions  Medication Sig Dispense Refill  . albuterol (PROVENTIL HFA;VENTOLIN HFA) 108 (90 BASE) MCG/ACT inhaler Inhale 2 puffs into the lungs every 6 (six) hours as needed. Asthma Symptoms       . ALPRAZolam (XANAX) 0.5 MG tablet Take 0.5 mg by mouth at bedtime as needed. Sleep/Anxiety      . butalbital-acetaminophen-caffeine (FIORICET WITH CODEINE) 50-325-40-30 MG per capsule Take 1  capsule by mouth every 4 (four) hours as needed. Headaches       . Butalbital-APAP-Caffeine (FIORICET PO) Take by mouth. 50/325 mg       . Cyanocobalamin (VITAMIN B 12 PO) Take 2 g by mouth.        . Cyanocobalamin (VITAMIN B-12 CR) 1000 MCG TBCR Take 2,000 mcg by mouth daily.        Marland Kitchen dextroamphetamine (DEXEDRINE SPANSULE) 15 MG 24 hr capsule Take 15 mg by mouth daily.        . diazepam (VALIUM) 5 MG tablet Take 5 mg by mouth every 6 (six) hours as needed.        . eletriptan (RELPAX) 40 MG tablet One tablet by mouth as needed for migraine headache.  If the headache improves and then returns, dose may be repeated after 2 hours have elapsed since first dose (do not exceed 80 mg per day). may repeat in 2 hours if necessary       . estrogens, conjugated, (PREMARIN) 1.25 MG tablet Take 1.25 mg by mouth daily.        Marland Kitchen FLUoxetine (PROZAC) 20 MG capsule Take 20 mg by mouth daily.        . Fluticasone-Salmeterol (ADVAIR DISKUS) 250-50 MCG/DOSE AEPB Inhale 1 puff into the lungs daily.       Marland Kitchen levothyroxine (SYNTHROID, LEVOTHROID) 112 MCG tablet Take 112 mcg by mouth daily.        Marland Kitchen levothyroxine (SYNTHROID, LEVOTHROID) 88 MCG tablet Take 88 mcg by mouth daily.        Marland Kitchen losartan-hydrochlorothiazide (HYZAAR) 50-12.5 MG per tablet Take 1 tablet by mouth daily.        . modafinil (PROVIGIL) 200 MG tablet Take 200 mg by mouth daily.        Marland Kitchen omega-3 acid ethyl esters (LOVAZA) 1 G capsule Take 2 g by mouth 2 (two) times daily.        . OnabotulinumtoxinA (BOTOX IJ) Inject as directed every 3 (three) months. Receives injection for migraines at Dr. Isidore Moos.      . Pancrelipase, Lip-Prot-Amyl, (ZENPEP) 25000 UNITS CPEP Take 3 capsules by mouth 3 (three) times daily with meals. 1 with snacks.  330 capsule  3  . pantoprazole (PROTONIX) 40 MG tablet Take 1 tablet (40 mg total) by mouth 2 (two) times daily.  60 tablet  11  . primidone (MYSOLINE) 50 MG tablet Take 50 mg by mouth 4 (four) times daily.        .  promethazine (PHENERGAN) 25 MG tablet Take  1 tablet every 6 hours as needed for nausea/vomiting  30 tablet  0  . simvastatin (ZOCOR) 20 MG tablet Take 20 mg by mouth at bedtime.        Marland Kitchen telmisartan-hydrochlorothiazide (MICARDIS HCT) 80-12.5 MG per tablet Take 1 tablet by mouth daily.        . tizanidine (ZANAFLEX) 2 MG capsule Take 2 mg by mouth 3 (three) times daily.        Marland Kitchen  VITAMIN D, CHOLECALCIFEROL, PO Take 2 g by mouth.        . pantoprazole (PROTONIX) 40 MG tablet Take 1 tablet (40 mg total) by mouth daily.  31 tablet  1  . polyethylene glycol-electrolytes (TRILYTE) 420 G solution Use as directed Also buy 1 fleet enema & 4 dulcolax tablets to use as directed  4000 mL  0    Allergies as of 08/11/2011 - Review Complete 08/11/2011  Allergen Reaction Noted  . Other  04/27/2011  . Amitriptyline Rash 02/24/2011  . Aspirin Rash 02/24/2011  . Codeine Rash   . Morphine and related Rash 02/24/2011  . Nsaids Rash 04/27/2011  . Penicillins Rash   . Propoxyphene n-acetaminophen Rash   . Sulfa antibiotics Rash 02/24/2011    Family History  Problem Relation Age of Onset  . Colon cancer Maternal Grandfather     deceased age 7  . Ulcerative colitis Father     ? question of UC    History   Social History  . Marital Status: Legally Separated    Spouse Name: N/A    Number of Children: N/A  . Years of Education: N/A   Social History Main Topics  . Smoking status: Former Smoker -- 1.0 packs/day for 40 years    Types: Cigarettes  . Smokeless tobacco: None   Comment: quit 3 years ago  . Alcohol Use: No  . Drug Use: No  . Sexually Active: None   Other Topics Concern  . None   Social History Narrative  . None    Review of Systems: Gen: Denies fever, chills, anorexia. Denies fatigue, weakness, weight loss.  CV: Denies chest pain, palpitations, syncope, peripheral edema, and claudication. Resp: Denies dyspnea at rest, cough, wheezing, coughing up blood, and pleurisy. GI:  Denies vomiting blood, jaundice, and fecal incontinence.   Denies dysphagia or odynophagia. Derm: Denies rash, itching, dry skin Psych: Denies depression, anxiety, memory loss, confusion. No homicidal or suicidal ideation.  Heme: Denies bruising, bleeding, and enlarged lymph nodes.  Physical Exam: BP 157/87  Pulse 94  Temp(Src) 98.2 F (36.8 C) (Temporal)  Ht 5\' 5"  (1.651 m)  Wt 243 lb 3.2 oz (110.315 kg)  BMI 40.47 kg/m2 General:   Alert and oriented. No distress noted. Pleasant and cooperative.  Head:  Normocephalic and atraumatic. Eyes:  Conjuctiva clear without scleral icterus. Mouth:  Oral mucosa pink and moist. Good dentition. No lesions. Neck:  Supple, without mass or thyromegaly. Heart:  S1, S2 present without murmurs, rubs, or gallops. Regular rate and rhythm. Abdomen:  +BS, soft, TTP right-sided abdomen and non-distended. No rebound or guarding. No HSM or masses noted. Msk:  Symmetrical without gross deformities. Normal posture. Extremities:  Without edema. Neurologic:  Alert and  oriented x4;  grossly normal neurologically. Skin:  Intact without significant lesions or rashes. Cervical Nodes:  No significant cervical adenopathy. Psych:  Alert and cooperative. Normal mood and affect.

## 2011-08-12 ENCOUNTER — Telehealth: Payer: Self-pay | Admitting: Gastroenterology

## 2011-08-12 ENCOUNTER — Encounter: Payer: Self-pay | Admitting: Gastroenterology

## 2011-08-12 DIAGNOSIS — R110 Nausea: Secondary | ICD-10-CM | POA: Insufficient documentation

## 2011-08-12 NOTE — Assessment & Plan Note (Signed)
Appears hx of chronic nausea. Despite complaints, continues to gain weight. Reflux worsening. Phenergan only thing that helps. On Protonix BID. Multifactorial at this point. Unable to r/o gastroparesis, so will obtain a GES. Also discussed low-fat diet and weight loss, as she continues to gain weight, worsening reflux symptoms.   GES in near future Further recommendations to follow

## 2011-08-12 NOTE — Assessment & Plan Note (Signed)
56 year old female with history of chronic diarrhea, thorough work-up in past. Stool studies negative, but fecal fat quantitative analysis quite elevated. CT from [facility] depicted possible pancreatic insufficiency; she was started on Zenpep and with increased dosage noted some improvement. She still reports 5 postprandial soft/slimy stools per day. She did have a colonoscopy with Dr. Linna Darner in June 2012, overall normal but biopsies showed focal acute colitis; she had been started on entocort by Dr. Linna Darner due to possible microscopic colitis, and this was d/c'ed after negative biopsies. She has been off this since around Sept/Oct. Interestingly, she is gaining weight rather than losing despite her complaints of diarrhea. She is up a total of 22 lbs since July of 2012. No hematochezia noted. No anemia on CBC.   As we have never performed a colonoscopy on patient, it would be prudent to obtain a baseline and take multiple biopsies to assess for etiology of diarrhea. We are still awaiting pending TSH. Although she did slightly improve with Zenpep, I am not entirely convinced this is treating the root cause. As of concern as well is possible pancreatic insufficiency on prior CT from [facility]. Pt denies any prior hx of trauma, pancreatitis. I will be discussing with Dr. Jena Gauss proceeding with an updated CT, possible EUS if indicated. She does note chronic right-sided abdominal pain.  Proceed with TCS with Dr. Jena Gauss in near future: the risks, benefits, and alternatives have been discussed with the patient in detail. The patient states understanding and desires to proceed.

## 2011-08-12 NOTE — Telephone Encounter (Signed)
PER AS request- case moved to ENDO- I left message on pts VM informing her of this & mailed new instruction sheet

## 2011-08-12 NOTE — Progress Notes (Signed)
Cc to PCP 

## 2011-08-12 NOTE — Assessment & Plan Note (Signed)
Chronic right-sided abdominal pain. See comments under "diarrhea". Considering CT scan in near future. Will need to discuss with Dr. Jena Gauss.

## 2011-08-13 ENCOUNTER — Encounter (HOSPITAL_COMMUNITY)
Admission: RE | Admit: 2011-08-13 | Discharge: 2011-08-13 | Disposition: A | Discharge status: Home / Self Care | Payer: Medicare Other | Source: Ambulatory Visit | Attending: Gastroenterology | Admitting: Gastroenterology

## 2011-08-13 ENCOUNTER — Encounter (HOSPITAL_COMMUNITY): Payer: Self-pay

## 2011-08-13 DIAGNOSIS — R110 Nausea: Secondary | ICD-10-CM | POA: Insufficient documentation

## 2011-08-13 DIAGNOSIS — R197 Diarrhea, unspecified: Secondary | ICD-10-CM | POA: Insufficient documentation

## 2011-08-13 MED ORDER — TECHNETIUM TC 99M SULFUR COLLOID
2.0000 | Freq: Once | INTRAVENOUS | Status: AC | PRN
  Administered 2011-08-13: 2 via ORAL

## 2011-08-17 NOTE — Progress Notes (Signed)
Quick Note:  Normal. Please instruct to follow a low-fat diet. Continue PPI for now. Proceed with colonoscopy as planned. ______

## 2011-08-18 NOTE — Progress Notes (Signed)
Quick Note:  Tried to call pt- Left detailed message on personal voicemail. tcs scheduled for 09/02/11 ______

## 2011-08-26 ENCOUNTER — Other Ambulatory Visit (HOSPITAL_COMMUNITY): Payer: Medicare Other

## 2011-08-30 ENCOUNTER — Encounter (HOSPITAL_COMMUNITY): Payer: Self-pay | Admitting: Pharmacy Technician

## 2011-09-01 ENCOUNTER — Telehealth: Payer: Self-pay | Admitting: Gastroenterology

## 2011-09-01 MED ORDER — SODIUM CHLORIDE 0.45 % IV SOLN
Freq: Once | INTRAVENOUS | Status: AC
  Administered 2011-09-02: 10:00:00 via INTRAVENOUS

## 2011-09-01 NOTE — Telephone Encounter (Signed)
Procedure time changed- pt aware of new time

## 2011-09-02 ENCOUNTER — Encounter (HOSPITAL_COMMUNITY)
Admission: RE | Disposition: A | Discharge status: Home / Self Care | Payer: Self-pay | Source: Ambulatory Visit | Attending: Internal Medicine

## 2011-09-02 ENCOUNTER — Ambulatory Visit: Admit: 2011-09-02 | Payer: Self-pay | Admitting: Internal Medicine

## 2011-09-02 ENCOUNTER — Ambulatory Visit (HOSPITAL_COMMUNITY)
Admission: RE | Admit: 2011-09-02 | Discharge: 2011-09-02 | Disposition: A | Discharge status: Home / Self Care | Payer: Medicare Other | Source: Ambulatory Visit | Attending: Internal Medicine | Admitting: Internal Medicine

## 2011-09-02 ENCOUNTER — Encounter (HOSPITAL_COMMUNITY): Payer: Self-pay

## 2011-09-02 ENCOUNTER — Other Ambulatory Visit: Payer: Self-pay | Admitting: Internal Medicine

## 2011-09-02 DIAGNOSIS — R110 Nausea: Secondary | ICD-10-CM

## 2011-09-02 DIAGNOSIS — K5730 Diverticulosis of large intestine without perforation or abscess without bleeding: Secondary | ICD-10-CM | POA: Insufficient documentation

## 2011-09-02 DIAGNOSIS — R197 Diarrhea, unspecified: Secondary | ICD-10-CM

## 2011-09-02 DIAGNOSIS — E7800 Pure hypercholesterolemia, unspecified: Secondary | ICD-10-CM | POA: Insufficient documentation

## 2011-09-02 DIAGNOSIS — I10 Essential (primary) hypertension: Secondary | ICD-10-CM | POA: Insufficient documentation

## 2011-09-02 DIAGNOSIS — Z79899 Other long term (current) drug therapy: Secondary | ICD-10-CM | POA: Insufficient documentation

## 2011-09-02 HISTORY — PX: COLONOSCOPY: SHX5424

## 2011-09-02 SURGERY — COLONOSCOPY WITH PROPOFOL
Anesthesia: Monitor Anesthesia Care

## 2011-09-02 SURGERY — COLONOSCOPY
Anesthesia: Moderate Sedation

## 2011-09-02 MED ORDER — PROMETHAZINE HCL 25 MG/ML IJ SOLN
25.0000 mg | Freq: Once | INTRAMUSCULAR | Status: AC
  Administered 2011-09-02: 25 mg via INTRAVENOUS

## 2011-09-02 MED ORDER — SODIUM CHLORIDE 0.9 % IJ SOLN
INTRAMUSCULAR | Status: AC
  Filled 2011-09-02: qty 10

## 2011-09-02 MED ORDER — MEPERIDINE HCL 100 MG/ML IJ SOLN
INTRAMUSCULAR | Status: AC
  Filled 2011-09-02: qty 2

## 2011-09-02 MED ORDER — MIDAZOLAM HCL 5 MG/5ML IJ SOLN
INTRAMUSCULAR | Status: DC | PRN
  Administered 2011-09-02: 1 mg via INTRAVENOUS
  Administered 2011-09-02 (×3): 2 mg via INTRAVENOUS
  Administered 2011-09-02: 1 mg via INTRAVENOUS
  Administered 2011-09-02: 2 mg via INTRAVENOUS

## 2011-09-02 MED ORDER — MEPERIDINE HCL 100 MG/ML IJ SOLN
INTRAMUSCULAR | Status: DC | PRN
  Administered 2011-09-02 (×2): 50 mg via INTRAVENOUS
  Administered 2011-09-02: 25 mg via INTRAVENOUS
  Administered 2011-09-02: 50 mg via INTRAVENOUS

## 2011-09-02 MED ORDER — STERILE WATER FOR IRRIGATION IR SOLN
Status: DC | PRN
  Administered 2011-09-02: 11:00:00

## 2011-09-02 MED ORDER — MIDAZOLAM HCL 5 MG/5ML IJ SOLN
INTRAMUSCULAR | Status: AC
  Filled 2011-09-02: qty 10

## 2011-09-02 MED ORDER — PROMETHAZINE HCL 25 MG/ML IJ SOLN
INTRAMUSCULAR | Status: AC
  Filled 2011-09-02: qty 1

## 2011-09-02 NOTE — H&P (Signed)
  I have seen & examined the patient prior to the procedure(s) today and reviewed the history and physical/consultation.  There have been no changes.  After consideration of the risks, benefits, alternatives and imponderables, the patient has consented to the procedure(s).   

## 2011-09-02 NOTE — Op Note (Signed)
[facility] [address] [place], [place]  [address]  COLONOSCOPY PROCEDURE REPORT  PATIENT:  Michelle, Rich  MR#:  [id number] BIRTHDATE:  1955-11-26, 56 yrs. old  GENDER:  female ENDOSCOPIST:  R. Roetta Sessions, MD FACP [facility] REF. BY:          Dr. Renaldo Harrison PROCEDURE DATE:  09/02/2011 PROCEDURE:  Ileocolonoscopy with segmental  biopsies and stool sampling  INDICATIONS:  Chronic diarrhea  INFORMED CONSENT:  The risks, benefits, alternatives and imponderables including but not limited to bleeding, perforation as well as the possibility of a missed lesion have been reviewed. The potential for biopsy, lesion removal, etc. have also been discussed.  Questions have been answered.  All parties agreeable. Please see the history and physical in the medical record for more information.  MEDICATIONS:  Phenergan 25 mg IV and Demerol 175 mg IV and Versed 10 mg IV in divided doses  DESCRIPTION OF PROCEDURE:  After a digital rectal exam was performed, the EC-[id number] ([id number]) colonoscope was advanced from the anus through the rectum and colon to the area of the cecum, ileocecal valve and appendiceal orifice.  The cecum was deeply intubated.  These structures were well-seen and photographed for the record.  From the level of the cecum and ileocecal valve, the scope was slowly and cautiously withdrawn.  The mucosal surfaces were carefully surveyed utilizing scope tip deflection to facilitate fold flattening as needed.  The scope was pulled down into the rectum where a thorough examination including retroflexion was performed. <<PROCEDUREIMAGES>>  FINDINGS:  Suboptimal preparation. Long tortuous colon. Shallow left-sided diverticula; remainder of colonic mucosa and distal 5 cm of terminal ileal mucosa appeared normal. Normal rectum aside from internal hemorrhoids.  THERAPEUTIC / DIAGNOSTIC MANEUVERS PERFORMED:  Stool sample obtained. Segmental biopsies of the ascending and  sigmoid segments takaen.  COMPLICATIONS:  None CECAL WITHDRAWAL TIME:  18 minutes  IMPRESSION:  Long tortuous colon; colonic diverticulosis-status post biopsy and stool sampling  RECOMMENDATIONS:  Follow up on pending studies.  ______________________________ R. Roetta Sessions, MD Caleen Essex  CC:  n. eSIGNED:   R. Roetta Sessions at 09/02/2011 11:39 AM  Modena Nunnery, [phone]

## 2011-09-03 LAB — FECAL LACTOFERRIN, QUANT: Fecal Lactoferrin: POSITIVE

## 2011-09-05 ENCOUNTER — Encounter: Payer: Self-pay | Admitting: Internal Medicine

## 2011-09-06 LAB — STOOL CULTURE

## 2011-09-07 ENCOUNTER — Encounter (HOSPITAL_COMMUNITY): Payer: Self-pay | Admitting: Internal Medicine

## 2011-09-09 ENCOUNTER — Encounter: Payer: Self-pay | Admitting: Gastroenterology

## 2011-09-09 NOTE — Progress Notes (Signed)
Please schedule routine OV with [place] in the next 2 months or son.

## 2011-09-17 ENCOUNTER — Other Ambulatory Visit: Payer: Self-pay

## 2011-09-17 MED ORDER — PROMETHAZINE HCL 25 MG PO TABS: 25.0000 mg | ORAL_TABLET | Freq: Four times a day (QID) | ORAL | Status: DC | PRN

## 2011-09-20 ENCOUNTER — Encounter: Payer: Self-pay | Admitting: Internal Medicine

## 2011-09-20 NOTE — Progress Notes (Signed)
Pt is aware of OV on 3/26 at 11 with RMR and appt card was mailed

## 2011-10-26 ENCOUNTER — Other Ambulatory Visit: Payer: Self-pay

## 2011-10-26 ENCOUNTER — Telehealth: Payer: Self-pay | Admitting: Internal Medicine

## 2011-10-26 ENCOUNTER — Ambulatory Visit (INDEPENDENT_AMBULATORY_CARE_PROVIDER_SITE_OTHER): Payer: Medicare Other | Admitting: Internal Medicine

## 2011-10-26 ENCOUNTER — Encounter: Payer: Self-pay | Admitting: Internal Medicine

## 2011-10-26 VITALS — BP 180/100 | HR 102 | Temp 97.6°F | Ht 65.0 in | Wt 240.8 lb

## 2011-10-26 DIAGNOSIS — R197 Diarrhea, unspecified: Secondary | ICD-10-CM

## 2011-10-26 NOTE — Telephone Encounter (Signed)
When patient was checking out she stated that we needed to change a medicine she is on. She is taking Vit D 5000 units instead of 2000 units.

## 2011-10-26 NOTE — Progress Notes (Signed)
Primary Care Physician:  ADDIS,DANIEL, DO, DO Primary Gastroenterologist:  Dr.   Pre-Procedure History & Physical: HPI:  Michelle Rich is a 56 y.o. female here for followup of diarrhea. Elevated stool fecal fat. Malodorous stools. Multiple flushes. Question pancreatic exocrine insufficiency. Primary care physician a Creon to her regimen recently as in patch causes nausea. Stool studies came back negative no evidence of sprue or small bowel biopsy or microscopic colitis. Polypoid changes in colonic mucosa for colonoscopy in 3 years recommended. Continued sinus drainage inflammation. Was given a Z-Pak recently. Diarrhea and says that 6-8 loose nonbloody stools daily.  Weight down 3 pounds since her last visit.  Past Medical History  Diagnosis Date  . IBS (irritable bowel syndrome)   . Chronic fatigue syndrome   . Hypertension   . Hypothyroidism   . Hypercholesteremia   . GERD (gastroesophageal reflux disease)     s/p Nissen  . S/P endoscopy 2009, 2012    normal, small bowel biopsy negative, giardia negative, celiac negative, 2012: reflux esophagitis, benign bx, negative celiac  . S/P colonoscopy     2007: internal/external hemorrhoids, otherwise normal by Dr. Murlean Hark. June 2012:normal but biopsy with focal acute colitis, non-specific by Dr. Linna Darner   . Asthma   . Shortness of breath   . Migraines   . Fibromyalgia   . Constipation   . Diarrhea   . Diverticulosis     Past Surgical History  Procedure Date  . 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  Marland Kitchen Umbilical herniorrhapy   . Carpal tunnel release     bilateral x2  . Elbow surgery     right  . Tonsillectomy   . Tubal ligation   . Rhinoplasty   . Cholecystectomy   . Steel rods and buttons for abdominal wound   . Trigger finger release     bilateral  . Nissen   . Abdominal hysterectomy   . Hernia repair   . Esophagogastroduodenoscopy 05/11/2011    Procedure:  ESOPHAGOGASTRODUODENOSCOPY (EGD);  Surgeon: Corbin Ade, MD;  Location: [facility];  Service: Endoscopy;  Laterality: N/A;  12:30  . Colonoscopy 09/02/2011    Procedure: COLONOSCOPY;  Surgeon: Corbin Ade, MD;  Location: [facility];  Service: Endoscopy;  Laterality: N/A;  11:20    Prior to Admission medications   Medication Sig Start Date End Date Taking? Authorizing Provider  albuterol (PROVENTIL HFA;VENTOLIN HFA) 108 (90 BASE) MCG/ACT inhaler Inhale 2 puffs into the lungs every 6 (six) hours as needed. Asthma Symptoms    Yes Historical Provider, MD  ALPRAZolam (XANAX) 0.5 MG tablet Take 0.5 mg by mouth 4 (four) times daily as needed. Sleep/Anxiety   Yes Historical Provider, MD  butalbital-acetaminophen-caffeine (FIORICET WITH CODEINE) 50-325-40-30 MG per capsule Take 1 capsule by mouth every 4 (four) hours as needed. Headaches    Yes Historical Provider, MD  Cholecalciferol (VITAMIN D) 2000 UNITS tablet Take 2,000 Units by mouth daily.   Yes Historical Provider, MD  Cyanocobalamin (VITAMIN B-12 CR) 1000 MCG TBCR Take 2,000 mcg by mouth daily.     Yes Historical Provider, MD  dextroamphetamine (DEXEDRINE SPANSULE) 15 MG 24 hr capsule Take 15 mg by mouth daily.     Yes Historical Provider, MD  estrogens, conjugated, (PREMARIN) 1.25 MG tablet Take 1.25 mg by mouth daily.     Yes Historical Provider, MD  FLUoxetine (PROZAC) 20 MG capsule Take 60 mg by mouth  daily.    Yes Historical Provider, MD  Fluticasone-Salmeterol (ADVAIR DISKUS) 250-50 MCG/DOSE AEPB Inhale 1 puff into the lungs daily.    Yes Historical Provider, MD  levothyroxine (SYNTHROID, LEVOTHROID) 88 MCG tablet Take 88 mcg by mouth daily.     Yes Historical Provider, MD  Milnacipran (SAVELLA) 50 MG TABS Take 50 mg by mouth daily.   Yes Historical Provider, MD  nefazodone (SERZONE) 200 MG tablet Take 100 mg by mouth at bedtime.   Yes Historical Provider, MD  omega-3 acid ethyl esters (LOVAZA) 1 G capsule Take 2 g by mouth 2  (two) times daily.     Yes Historical Provider, MD  OnabotulinumtoxinA (BOTOX IJ) Inject as directed every 3 (three) months. Receives injection for migraines at Dr. Isidore Moos.   Yes Historical Provider, MD  Pancrelipase, Lip-Prot-Amyl, (ZENPEP) 25000 UNITS CPEP Take 3 capsules by mouth 3 (three) times daily with meals. 1 with snacks. 04/22/11  Yes Nira Retort, NP  pantoprazole (PROTONIX) 40 MG tablet Take 1 tablet (40 mg total) by mouth 2 (two) times daily. 05/11/11 05/10/12 Yes Corbin Ade, MD  promethazine (PHENERGAN) 25 MG tablet Take 1 tablet (25 mg total) by mouth every 6 (six) hours as needed. for nausea/vomiting 09/17/11  Yes Tiffany Kocher, PA  simvastatin (ZOCOR) 80 MG tablet Take 80 mg by mouth at bedtime.   Yes Historical Provider, MD  telmisartan-hydrochlorothiazide (MICARDIS HCT) 80-12.5 MG per tablet Take 1 tablet by mouth daily.     Yes Historical Provider, MD  tiZANidine (ZANAFLEX) 4 MG tablet Take 4 mg by mouth 3 (three) times daily.   Yes Historical Provider, MD    Allergies as of 10/26/2011 - Review Complete 10/26/2011  Allergen Reaction Noted  . Other  04/27/2011  . Amitriptyline Rash 02/24/2011  . Aspirin Rash 02/24/2011  . Codeine Rash   . Morphine and related Rash 02/24/2011  . Nsaids Rash 04/27/2011  . Penicillins Rash   . Propoxyphene n-acetaminophen Rash   . Sulfa antibiotics Rash 02/24/2011    Family History  Problem Relation Age of Onset  . Colon cancer Maternal Grandfather     deceased age 60  . Ulcerative colitis Father     ? question of UC  . Anesthesia problems Neg Hx   . Hypotension Neg Hx   . Malignant hyperthermia Neg Hx   . Pseudochol deficiency Neg Hx     History   Social History  . Marital Status: Legally Separated    Spouse Name: N/A    Number of Children: N/A  . Years of Education: N/A   Occupational History  . Not on file.   Social History Main Topics  . Smoking status: Former Smoker -- 1.0 packs/day for 40 years    Types:  Cigarettes    Quit date: 09/01/2006  . Smokeless tobacco: Not on file   Comment: quit 3 years ago  . Alcohol Use: No  . Drug Use: No  . Sexually Active: Yes    Birth Control/ Protection: Surgical   Other Topics Concern  . Not on file   Social History Narrative  . No narrative on file    Review of Systems: See HPI, otherwise negative ROS  Physical Exam: BP 180/100  Pulse 102  Temp(Src) 97.6 F (36.4 C) (Temporal)  Ht 5\' 5"  (1.651 m)  Wt 240 lb 12.8 oz (109.226 kg)  BMI 40.07 kg/m2 General:   Alert,  sounds congested. Well-developed, well-nourished, pleasant and cooperative in NAD Skin:  Intact  without significant lesions or rashes. Eyes:  Sclera clear, no icterus.   Conjunctiva pink. Ears:  Normal auditory acuity. Nose:  No deformity, discharge,  or lesions. Mouth:  No deformity or lesions. Neck:  Supple; no masses or thyromegaly. No significant cervical adenopathy. Lungs:  Clear throughout to auscultation.   No wheezes, crackles, or rhonchi. No acute distress. Heart:  Regular rate and rhythm; no murmurs, clicks, rubs,  or gallops. Abdomen: Non-distended, normal bowel sounds.  Soft and nontender without appreciable mass or hepatosplenomegaly.  Pulses:  Normal pulses noted. Extremities:  Without clubbing or edema.  Impression/Plan:

## 2011-10-26 NOTE — Telephone Encounter (Signed)
I changed it.   Thanks

## 2011-10-26 NOTE — Assessment & Plan Note (Signed)
Refractory diarrhea. Recent antibiotic exposure. Question pancreatic exocrine deficiency. Nausea was in Pap.  Blood pressure inadequately controlled.  Recommendations: Stops in Pap. Continue Creon 20 4000 lipase units 3 capsules 3 times a day prescription and samples provided.  A trial of Questran 4 g orally daily not taken within 2 hours of other medications. All labile nature of this medication reviewed.  [facility] care physician in reference to how blood pressure.  Repeat C. difficile toxin PCR assay with recent antibiotics exposure.  Office visit in 6 weeks.

## 2011-10-26 NOTE — Patient Instructions (Addendum)
Stop Xen pap  Begin Creon 24,003 capsules with each meal; one with snacks  8 Questran off label use for diarrhea 4 g orally daily. Do not take within 2 hours of other medications.  Repeat C. difficile toxin PCR assay  Office visit with [place] in 6 weeks.   See primary care doctor about your elevated blood pressure.

## 2011-11-23 ENCOUNTER — Other Ambulatory Visit: Payer: Self-pay | Admitting: Internal Medicine

## 2011-11-26 LAB — CLOSTRIDIUM DIFFICILE BY PCR: Toxigenic C. Difficile by PCR: NOT DETECTED

## 2011-12-03 ENCOUNTER — Encounter: Payer: Self-pay | Admitting: Internal Medicine

## 2011-12-03 ENCOUNTER — Ambulatory Visit (INDEPENDENT_AMBULATORY_CARE_PROVIDER_SITE_OTHER): Payer: Medicare Other | Admitting: Internal Medicine

## 2011-12-03 VITALS — BP 149/89 | HR 93 | Temp 98.0°F | Ht 65.0 in | Wt 232.4 lb

## 2011-12-03 DIAGNOSIS — Z86010 Personal history of colonic polyps: Secondary | ICD-10-CM

## 2011-12-03 DIAGNOSIS — R197 Diarrhea, unspecified: Secondary | ICD-10-CM

## 2011-12-03 MED ORDER — PEG 3350-KCL-NA BICARB-NACL 420 G PO SOLR: ORAL | Status: AC

## 2011-12-03 NOTE — Progress Notes (Signed)
Primary Care Physician:  ADDIS,DANIEL, DO, DO Primary Gastroenterologist:  Dr. Fardowsa Authier  Pre-Procedure History & Physical: HPI:  Michelle Rich is a 56 y.o. female here for followup of diarrhea. Still having 4-5 mushy bowel movements daily. She states they are particularly malodorous. History of elevated fat in stool. However no improvement with Creon 20 . Biopsy small bowel negative for sprue. Biopsies of colon negative for microscopic colitis. However, adenomatous tissue seen on biopsies. Prep was marginal. There were no polyps present. These were random, segmental biopsies. She is maintaining her appetite. No vomiting. In notably, no weight loss. She tells me she has stopped her Savella, primidone and Seroquel on her own and without consultation by the prescribing physician.  She has not lost any weight. Past Medical History  Diagnosis Date  . IBS (irritable bowel syndrome)   . Chronic fatigue syndrome   . Hypertension   . Hypothyroidism   . Hypercholesteremia   . GERD (gastroesophageal reflux disease)     s/p Nissen  . S/P endoscopy 2009, 2012    normal, small bowel biopsy negative, giardia negative, celiac negative, 2012: reflux esophagitis, benign bx, negative celiac  . S/P colonoscopy     2007: internal/external hemorrhoids, otherwise normal by Dr. DeMasn. June 2012:normal but biopsy with focal acute colitis, non-specific by Dr. Anwar   . Asthma   . Shortness of breath   . Migraines   . Fibromyalgia   . Constipation   . Diarrhea   . Diverticulosis   . Adenoma of large intestine     "flat"    Past Surgical History  Procedure Date  . 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  . Umbilical herniorrhapy   . Carpal tunnel release     bilateral x2  . Elbow surgery     right  . Tonsillectomy   . Tubal ligation   . Rhinoplasty   . Cholecystectomy   . Steel rods and buttons for abdominal wound   . Trigger finger release    bilateral  . Nissen   . Abdominal hysterectomy   . Hernia repair   . Esophagogastroduodenoscopy 05/11/2011    Dr. Remee Charley- benign mucosa  . Colonoscopy 09/02/2011    Dr. Kaito Schulenburg-flat adenomas    Prior to Admission medications   Medication Sig Start Date End Date Taking? Authorizing Provider  albuterol (PROVENTIL HFA;VENTOLIN HFA) 108 (90 BASE) MCG/ACT inhaler Inhale 2 puffs into the lungs every 6 (six) hours as needed. Asthma Symptoms    Yes Historical Provider, MD  ALPRAZolam (XANAX) 0.5 MG tablet Take 0.5 mg by mouth 4 (four) times daily as needed. Sleep/Anxiety   Yes Historical Provider, MD  butalbital-acetaminophen-caffeine (FIORICET WITH CODEINE) 50-325-40-30 MG per capsule Take 1 capsule by mouth every 4 (four) hours as needed. Headaches    Yes Historical Provider, MD  Cyanocobalamin (VITAMIN B-12 CR) 1000 MCG TBCR Take 2,000 mcg by mouth daily.     Yes Historical Provider, MD  dextroamphetamine (DEXEDRINE SPANSULE) 15 MG 24 hr capsule Take 15 mg by mouth daily.     Yes Historical Provider, MD  estrogens, conjugated, (PREMARIN) 1.25 MG tablet Take 1.25 mg by mouth daily.     Yes Historical Provider, MD  FLUoxetine (PROZAC) 20 MG capsule Take 60 mg by mouth daily.    Yes Historical Provider, MD  Fluticasone-Salmeterol (ADVAIR DISKUS) 250-50 MCG/DOSE AEPB Inhale 1 puff into the lungs daily.    Yes Historical Provider, MD    levothyroxine (SYNTHROID, LEVOTHROID) 88 MCG tablet Take 88 mcg by mouth daily.     Yes Historical Provider, MD  Milnacipran (SAVELLA) 50 MG TABS Take 50 mg by mouth daily.   Yes Historical Provider, MD  nefazodone (SERZONE) 200 MG tablet Take 100 mg by mouth at bedtime.   Yes Historical Provider, MD  omega-3 acid ethyl esters (LOVAZA) 1 G capsule Take 2 g by mouth 2 (two) times daily.     Yes Historical Provider, MD  OnabotulinumtoxinA (BOTOX IJ) Inject as directed every 3 (three) months. Receives injection for migraines at Dr. Office.   Yes Historical Provider, MD    Pancrelipase, Lip-Prot-Amyl, (ZENPEP) 25000 UNITS CPEP Take 3 capsules by mouth 3 (three) times daily with meals. 1 with snacks. 04/22/11  Yes Anna W Sams, NP  pantoprazole (PROTONIX) 40 MG tablet Take 1 tablet (40 mg total) by mouth 2 (two) times daily. 05/11/11 05/10/12 Yes Estefany Goebel M Tashera Montalvo, MD  promethazine (PHENERGAN) 25 MG tablet Take 1 tablet (25 mg total) by mouth every 6 (six) hours as needed. for nausea/vomiting 09/17/11  Yes Leslie S Lewis, PA  simvastatin (ZOCOR) 80 MG tablet Take 80 mg by mouth at bedtime.   Yes Historical Provider, MD  telmisartan-hydrochlorothiazide (MICARDIS HCT) 80-12.5 MG per tablet Take 1 tablet by mouth daily.     Yes Historical Provider, MD  tiZANidine (ZANAFLEX) 4 MG tablet Take 4 mg by mouth 3 (three) times daily.   Yes Historical Provider, MD  Vitamin D, Ergocalciferol, (DRISDOL) 50000 UNITS CAPS Take 50,000 Units by mouth.   Yes Historical Provider, MD    Allergies as of 12/03/2011 - Review Complete 12/03/2011  Allergen Reaction Noted  . Other  04/27/2011  . Amitriptyline Rash 02/24/2011  . Aspirin Rash 02/24/2011  . Codeine Rash   . Morphine and related Rash 02/24/2011  . Nsaids Rash 04/27/2011  . Penicillins Rash   . Propoxyphene-acetaminophen Rash   . Sulfa antibiotics Rash 02/24/2011    Family History  Problem Relation Age of Onset  . Colon cancer Maternal Grandfather     deceased age 70  . Ulcerative colitis Father     ? question of UC  . Anesthesia problems Neg Hx   . Hypotension Neg Hx   . Malignant hyperthermia Neg Hx   . Pseudochol deficiency Neg Hx     History   Social History  . Marital Status: Legally Separated    Spouse Name: N/A    Number of Children: N/A  . Years of Education: N/A   Occupational History  . Not on file.   Social History Main Topics  . Smoking status: Former Smoker -- 1.0 packs/day for 40 years    Types: Cigarettes    Quit date: 09/01/2006  . Smokeless tobacco: Not on file   Comment: quit 3 years ago   . Alcohol Use: No  . Drug Use: No  . Sexually Active: Yes    Birth Control/ Protection: Surgical   Other Topics Concern  . Not on file   Social History Narrative  . No narrative on file    Review of Systems: See HPI, otherwise negative ROS  Physical Exam: BP 149/89  Pulse 93  Temp(Src) 98 F (36.7 C) (Temporal)  Ht 5' 5" (1.651 m)  Wt 232 lb 6.4 oz (105.416 kg)  BMI 38.67 kg/m2 General:   Alert, obese Well-developed, well-nourished, pleasant and cooperative in NAD Skin:  Intact without significant lesions or rashes. Eyes:  Sclera clear, no icterus.     Conjunctiva pink. Ears:  Normal auditory acuity. Nose:  No deformity, discharge,  or lesions. Mouth:  No deformity or lesions. Neck:  Supple; no masses or thyromegaly. No significant cervical adenopathy. Lungs:  Clear throughout to auscultation.   No wheezes, crackles, or rhonchi. No acute distress. Heart:  Regular rate and rhythm; no murmurs, clicks, rubs,  or gallops. Abdomen: Non-distended, normal bowel sounds.  Soft and nontender without appreciable mass or hepatosplenomegaly.  Pulses:  Normal pulses noted. Extremities:  Without clubbing or edema.  

## 2011-12-03 NOTE — Patient Instructions (Addendum)
Needs a colonoscopy with a good prep to follow-up on "adenoma" on biopsies - last TCS  No change in medication for now.   You should let the prescribing physicians knowvthat you he stopped Seroquel, primidone and Savella.

## 2011-12-04 NOTE — Assessment & Plan Note (Signed)
Pleasant 56 year old lady with diarrhea. Elevated fat in stool. Positive lactoferrin. Antibiotics in the recent past but C. difficile repeatedly negative. No improvement with probiotic and Questran. No improvement with pancreatic enzyme replacement. She has not lost any weight.  Adenomatous changes on segmental biopsies of otherwise normal-appearing colon endoscopically is somewhat bothersome. Prep was not that good.  Recommendations: Patient needs to have have another colonoscopy in the setting of a good prep to further evaluate because he surfaces of her colon.The risks, benefits, limitations, alternatives and imponderables have been reviewed with the patient. Questions have been answered. All parties are agreeable.   Further recommendations to follow in the very near future.

## 2011-12-06 ENCOUNTER — Other Ambulatory Visit: Payer: Self-pay

## 2011-12-06 MED ORDER — PROMETHAZINE HCL 25 MG PO TABS: 25.0000 mg | ORAL_TABLET | Freq: Four times a day (QID) | ORAL | Status: DC | PRN

## 2011-12-07 ENCOUNTER — Telehealth: Payer: Self-pay | Admitting: Gastroenterology

## 2011-12-07 ENCOUNTER — Encounter (HOSPITAL_COMMUNITY): Payer: Self-pay

## 2011-12-07 NOTE — Telephone Encounter (Signed)
Pt's procedure time moved up to 7:30- she is aware of this change

## 2011-12-08 MED ORDER — SODIUM CHLORIDE 0.45 % IV SOLN
Freq: Once | INTRAVENOUS | Status: AC
  Administered 2011-12-09: 1000 mL via INTRAVENOUS

## 2011-12-09 ENCOUNTER — Encounter (HOSPITAL_COMMUNITY): Payer: Self-pay | Admitting: *Deleted

## 2011-12-09 ENCOUNTER — Ambulatory Visit (HOSPITAL_COMMUNITY)
Admission: RE | Admit: 2011-12-09 | Discharge: 2011-12-09 | Disposition: A | Discharge status: Home / Self Care | Payer: Medicare Other | Source: Ambulatory Visit | Attending: Internal Medicine | Admitting: Internal Medicine

## 2011-12-09 ENCOUNTER — Encounter (HOSPITAL_COMMUNITY)
Admission: RE | Disposition: A | Discharge status: Home / Self Care | Payer: Self-pay | Source: Ambulatory Visit | Attending: Internal Medicine

## 2011-12-09 DIAGNOSIS — Z79899 Other long term (current) drug therapy: Secondary | ICD-10-CM | POA: Insufficient documentation

## 2011-12-09 DIAGNOSIS — D126 Benign neoplasm of colon, unspecified: Secondary | ICD-10-CM | POA: Insufficient documentation

## 2011-12-09 DIAGNOSIS — R197 Diarrhea, unspecified: Secondary | ICD-10-CM | POA: Insufficient documentation

## 2011-12-09 DIAGNOSIS — E7800 Pure hypercholesterolemia, unspecified: Secondary | ICD-10-CM | POA: Insufficient documentation

## 2011-12-09 DIAGNOSIS — I10 Essential (primary) hypertension: Secondary | ICD-10-CM | POA: Insufficient documentation

## 2011-12-09 DIAGNOSIS — K5730 Diverticulosis of large intestine without perforation or abscess without bleeding: Secondary | ICD-10-CM

## 2011-12-09 DIAGNOSIS — Z86010 Personal history of colonic polyps: Secondary | ICD-10-CM

## 2011-12-09 HISTORY — PX: COLONOSCOPY: SHX5424

## 2011-12-09 SURGERY — COLONOSCOPY
Anesthesia: Moderate Sedation

## 2011-12-09 MED ORDER — MEPERIDINE HCL 100 MG/ML IJ SOLN
INTRAMUSCULAR | Status: AC
  Filled 2011-12-09: qty 2

## 2011-12-09 MED ORDER — MEPERIDINE HCL 100 MG/ML IJ SOLN
INTRAMUSCULAR | Status: DC | PRN
  Administered 2011-12-09: 25 mg via INTRAVENOUS
  Administered 2011-12-09 (×2): 50 mg via INTRAVENOUS

## 2011-12-09 MED ORDER — MIDAZOLAM HCL 5 MG/5ML IJ SOLN
INTRAMUSCULAR | Status: DC | PRN
  Administered 2011-12-09 (×2): 2 mg via INTRAVENOUS
  Administered 2011-12-09: 1 mg via INTRAVENOUS
  Administered 2011-12-09: 2 mg via INTRAVENOUS

## 2011-12-09 MED ORDER — MIDAZOLAM HCL 5 MG/5ML IJ SOLN
INTRAMUSCULAR | Status: AC
  Filled 2011-12-09: qty 10

## 2011-12-09 NOTE — Discharge Instructions (Addendum)
Colonoscopy Discharge Instructions  Read the instructions outlined below and refer to this sheet in the next few weeks. These discharge instructions provide you with general information on caring for yourself after you leave the hospital. Your doctor may also give you specific instructions. While your treatment has been planned according to the most current medical practices available, unavoidable complications occasionally occur. If you have any problems or questions after discharge, call Dr. Jena Gauss at [phone]. ACTIVITY  You may resume your regular activity, but move at a slower pace for the next 24 hours.   Take frequent rest periods for the next 24 hours.   Walking will help get rid of the air and reduce the bloated feeling in your belly (abdomen).   No driving for 24 hours (because of the medicine (anesthesia) used during the test).    Do not sign any important legal documents or operate any machinery for 24 hours (because of the anesthesia used during the test).  NUTRITION  Drink plenty of fluids.   You may resume your normal diet as instructed by your doctor.   Begin with a light meal and progress to your normal diet. Heavy or fried foods are harder to digest and may make you feel sick to your stomach (nauseated).   Avoid alcoholic beverages for 24 hours or as instructed.  MEDICATIONS  You may resume your normal medications unless your doctor tells you otherwise.  WHAT YOU CAN EXPECT TODAY  Some feelings of bloating in the abdomen.   Passage of more gas than usual.   Spotting of blood in your stool or on the toilet paper.  IF YOU HAD POLYPS REMOVED DURING THE COLONOSCOPY:  No aspirin products for 7 days or as instructed.   No alcohol for 7 days or as instructed.   Eat a soft diet for the next 24 hours.  FINDING OUT THE RESULTS OF YOUR TEST Not all test results are available during your visit. If your test results are not back during the visit, make an appointment  with your caregiver to find out the results. Do not assume everything is normal if you have not heard from your caregiver or the medical facility. It is important for you to follow up on all of your test results.  SEEK IMMEDIATE MEDICAL ATTENTION IF:  You have more than a spotting of blood in your stool.   Your belly is swollen (abdominal distention).   You are nauseated or vomiting.   You have a temperature over 101.   You have abdominal pain or discomfort that is severe or gets worse throughout the day.     Diverticulosis and polyp information provided.  Further recommendations to follow pending review study results.Colon Polyps A polyp is extra tissue that grows inside your body. Colon polyps grow in the large intestine. The large intestine, also called the colon, is part of your digestive system. It is a long, hollow tube at the end of your digestive tract where your body makes and stores stool. Most polyps are not dangerous. They are benign. This means they are not cancerous. But over time, some types of polyps can turn into cancer. Polyps that are smaller than a pea are usually not harmful. But larger polyps could someday become or may already be cancerous. To be safe, doctors remove all polyps and test them.  WHO GETS POLYPS? Anyone can get polyps, but certain people are more likely than others. You may have a greater chance of getting polyps if: You  are over 50.  You have had polyps before.  Someone in your family has had polyps.  Someone in your family has had cancer of the large intestine.  Find out if someone in your family has had polyps. You may also be more likely to get polyps if you:  Eat a lot of fatty foods.  Smoke.  Drink alcohol.  Do not exercise.  Eat too much.  SYMPTOMS  Most small polyps do not cause symptoms. People often do not know they have one until their caregiver finds it during a regular checkup or while testing them for something else. Some people do  have symptoms like these: Bleeding from the anus. You might notice blood on your underwear or on toilet paper after you have had a bowel movement.  Constipation or diarrhea that lasts more than a week.  Blood in the stool. Blood can make stool look black or it can show up as red streaks in the stool.  If you have any of these symptoms, see your caregiver. HOW DOES THE DOCTOR TEST FOR POLYPS? The doctor can use four tests to check for polyps: Digital rectal exam. The caregiver wears gloves and checks your rectum (the last part of the large intestine) to see if it feels normal. This test would find polyps only in the rectum. Your caregiver may need to do one of the other tests listed below to find polyps higher up in the intestine.  Barium enema. The caregiver puts a liquid called barium into your rectum before taking x-rays of your large intestine. Barium makes your intestine look white in the pictures. Polyps are dark, so they are easy to see.  Sigmoidoscopy. With this test, the caregiver can see inside your large intestine. A thin flexible tube is placed into your rectum. The device is called a sigmoidoscope, which has a light and a tiny video camera in it. The caregiver uses the sigmoidoscope to look at the last third of your large intestine.  Colonoscopy. This test is like sigmoidoscopy, but the caregiver looks at all of the large intestine. It usually requires sedation. This is the most common method for finding and removing polyps.  TREATMENT  The caregiver will remove the polyp during sigmoidoscopy or colonoscopy. The polyp is then tested for cancer.  If you have had polyps, your caregiver may want you to get tested regularly in the future.  PREVENTION  There is not one sure way to prevent polyps. You might be able to lower your risk of getting them if you: Eat more fruits and vegetables and less fatty food.  Do not smoke.  Avoid alcohol.  Exercise every day.  Lose weight if you are  overweight.  Eating more calcium and folate can also lower your risk of getting polyps. Some foods that are rich in calcium are milk, cheese, and broccoli. Some foods that are rich in folate are chickpeas, kidney beans, and spinach.  Aspirin might help prevent polyps. Studies are under way.  Document Released: 04/14/2004 Document Revised: 07/08/2011 Document Reviewed: 09/20/2007 [facility] Patient Information 2012 [place], [place].Diverticulosis Diverticulosis is a common condition that develops when small pouches (diverticula) form in the wall of the colon. The risk of diverticulosis increases with age. It happens more often in people who eat a low-fiber diet. Most individuals with diverticulosis have no symptoms. Those individuals with symptoms usually experience abdominal pain, constipation, or loose stools (diarrhea). HOME CARE INSTRUCTIONS  Increase the amount of fiber in your diet as directed by  your caregiver or dietician. This may reduce symptoms of diverticulosis.  Your caregiver may recommend taking a dietary fiber supplement.  Drink at least 6 to 8 glasses of water each day to prevent constipation.  Try not to strain when you have a bowel movement.  Your caregiver may recommend avoiding nuts and seeds to prevent complications, although this is still an uncertain benefit.  Only take over-the-counter or prescription medicines for pain, discomfort, or fever as directed by your caregiver.  FOODS WITH HIGH FIBER CONTENT INCLUDE: Fruits. Apple, peach, pear, tangerine, raisins, prunes.  Vegetables. Brussels sprouts, asparagus, broccoli, cabbage, carrot, cauliflower, romaine lettuce, spinach, summer squash, tomato, winter squash, zucchini.  Starchy Vegetables. Baked beans, kidney beans, lima beans, split peas, lentils, potatoes (with skin).  Grains. Whole wheat bread, brown rice, bran flake cereal, plain oatmeal, white rice, shredded wheat, bran muffins.  SEEK IMMEDIATE MEDICAL CARE IF:  You  develop increasing pain or severe bloating.  You have an oral temperature above 102 F (38.9 C), not controlled by medicine.  You develop vomiting or bowel movements that are bloody or black.  Document Released: 04/15/2004 Document Revised: 07/08/2011 Document Reviewed: 12/17/2009 [facility] Patient Information 2012 [place], [place].

## 2011-12-09 NOTE — H&P (View-Only) (Signed)
Primary Care Physician:  ADDIS,DANIEL, DO, DO Primary Gastroenterologist:  Dr. Jena Gauss  Pre-Procedure History & Physical: HPI:  Michelle Rich is a 56 y.o. female here for followup of diarrhea. Still having 4-5 mushy bowel movements daily. She states they are particularly malodorous. History of elevated fat in stool. However no improvement with Creon 20 . Biopsy small bowel negative for sprue. Biopsies of colon negative for microscopic colitis. However, adenomatous tissue seen on biopsies. Prep was marginal. There were no polyps present. These were random, segmental biopsies. She is maintaining her appetite. No vomiting. In notably, no weight loss. She tells me she has stopped her Savella, primidone and Seroquel on her own and without consultation by the prescribing physician.  She has not lost any weight. Past Medical History  Diagnosis Date  . IBS (irritable bowel syndrome)   . Chronic fatigue syndrome   . Hypertension   . Hypothyroidism   . Hypercholesteremia   . GERD (gastroesophageal reflux disease)     s/p Nissen  . S/P endoscopy 2009, 2012    normal, small bowel biopsy negative, giardia negative, celiac negative, 2012: reflux esophagitis, benign bx, negative celiac  . S/P colonoscopy     2007: internal/external hemorrhoids, otherwise normal by Dr. Murlean Hark. June 2012:normal but biopsy with focal acute colitis, non-specific by Dr. Linna Darner   . Asthma   . Shortness of breath   . Migraines   . Fibromyalgia   . Constipation   . Diarrhea   . Diverticulosis   . Adenoma of large intestine     "flat"    Past Surgical History  Procedure Date  . 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  Marland Kitchen Umbilical herniorrhapy   . Carpal tunnel release     bilateral x2  . Elbow surgery     right  . Tonsillectomy   . Tubal ligation   . Rhinoplasty   . Cholecystectomy   . Steel rods and buttons for abdominal wound   . Trigger finger release    bilateral  . Nissen   . Abdominal hysterectomy   . Hernia repair   . Esophagogastroduodenoscopy 05/11/2011    Dr. Jena Gauss- benign mucosa  . Colonoscopy 09/02/2011    Dr. Lebron Conners adenomas    Prior to Admission medications   Medication Sig Start Date End Date Taking? Authorizing Provider  albuterol (PROVENTIL HFA;VENTOLIN HFA) 108 (90 BASE) MCG/ACT inhaler Inhale 2 puffs into the lungs every 6 (six) hours as needed. Asthma Symptoms    Yes Historical Provider, MD  ALPRAZolam (XANAX) 0.5 MG tablet Take 0.5 mg by mouth 4 (four) times daily as needed. Sleep/Anxiety   Yes Historical Provider, MD  butalbital-acetaminophen-caffeine (FIORICET WITH CODEINE) 50-325-40-30 MG per capsule Take 1 capsule by mouth every 4 (four) hours as needed. Headaches    Yes Historical Provider, MD  Cyanocobalamin (VITAMIN B-12 CR) 1000 MCG TBCR Take 2,000 mcg by mouth daily.     Yes Historical Provider, MD  dextroamphetamine (DEXEDRINE SPANSULE) 15 MG 24 hr capsule Take 15 mg by mouth daily.     Yes Historical Provider, MD  estrogens, conjugated, (PREMARIN) 1.25 MG tablet Take 1.25 mg by mouth daily.     Yes Historical Provider, MD  FLUoxetine (PROZAC) 20 MG capsule Take 60 mg by mouth daily.    Yes Historical Provider, MD  Fluticasone-Salmeterol (ADVAIR DISKUS) 250-50 MCG/DOSE AEPB Inhale 1 puff into the lungs daily.    Yes Historical Provider, MD  levothyroxine (SYNTHROID, LEVOTHROID) 88 MCG tablet Take 88 mcg by mouth daily.     Yes Historical Provider, MD  Milnacipran (SAVELLA) 50 MG TABS Take 50 mg by mouth daily.   Yes Historical Provider, MD  nefazodone (SERZONE) 200 MG tablet Take 100 mg by mouth at bedtime.   Yes Historical Provider, MD  omega-3 acid ethyl esters (LOVAZA) 1 G capsule Take 2 g by mouth 2 (two) times daily.     Yes Historical Provider, MD  OnabotulinumtoxinA (BOTOX IJ) Inject as directed every 3 (three) months. Receives injection for migraines at Dr. Isidore Moos.   Yes Historical Provider, MD    Pancrelipase, Lip-Prot-Amyl, (ZENPEP) 25000 UNITS CPEP Take 3 capsules by mouth 3 (three) times daily with meals. 1 with snacks. 04/22/11  Yes Nira Retort, NP  pantoprazole (PROTONIX) 40 MG tablet Take 1 tablet (40 mg total) by mouth 2 (two) times daily. 05/11/11 05/10/12 Yes Corbin Ade, MD  promethazine (PHENERGAN) 25 MG tablet Take 1 tablet (25 mg total) by mouth every 6 (six) hours as needed. for nausea/vomiting 09/17/11  Yes Tiffany Kocher, PA  simvastatin (ZOCOR) 80 MG tablet Take 80 mg by mouth at bedtime.   Yes Historical Provider, MD  telmisartan-hydrochlorothiazide (MICARDIS HCT) 80-12.5 MG per tablet Take 1 tablet by mouth daily.     Yes Historical Provider, MD  tiZANidine (ZANAFLEX) 4 MG tablet Take 4 mg by mouth 3 (three) times daily.   Yes Historical Provider, MD  Vitamin D, Ergocalciferol, (DRISDOL) 50000 UNITS CAPS Take 50,000 Units by mouth.   Yes Historical Provider, MD    Allergies as of 12/03/2011 - Review Complete 12/03/2011  Allergen Reaction Noted  . Other  04/27/2011  . Amitriptyline Rash 02/24/2011  . Aspirin Rash 02/24/2011  . Codeine Rash   . Morphine and related Rash 02/24/2011  . Nsaids Rash 04/27/2011  . Penicillins Rash   . Propoxyphene-acetaminophen Rash   . Sulfa antibiotics Rash 02/24/2011    Family History  Problem Relation Age of Onset  . Colon cancer Maternal Grandfather     deceased age 38  . Ulcerative colitis Father     ? question of UC  . Anesthesia problems Neg Hx   . Hypotension Neg Hx   . Malignant hyperthermia Neg Hx   . Pseudochol deficiency Neg Hx     History   Social History  . Marital Status: Legally Separated    Spouse Name: N/A    Number of Children: N/A  . Years of Education: N/A   Occupational History  . Not on file.   Social History Main Topics  . Smoking status: Former Smoker -- 1.0 packs/day for 40 years    Types: Cigarettes    Quit date: 09/01/2006  . Smokeless tobacco: Not on file   Comment: quit 3 years ago   . Alcohol Use: No  . Drug Use: No  . Sexually Active: Yes    Birth Control/ Protection: Surgical   Other Topics Concern  . Not on file   Social History Narrative  . No narrative on file    Review of Systems: See HPI, otherwise negative ROS  Physical Exam: BP 149/89  Pulse 93  Temp(Src) 98 F (36.7 C) (Temporal)  Ht 5\' 5"  (1.651 m)  Wt 232 lb 6.4 oz (105.416 kg)  BMI 38.67 kg/m2 General:   Alert, obese Well-developed, well-nourished, pleasant and cooperative in NAD Skin:  Intact without significant lesions or rashes. Eyes:  Sclera clear, no icterus.  Conjunctiva pink. Ears:  Normal auditory acuity. Nose:  No deformity, discharge,  or lesions. Mouth:  No deformity or lesions. Neck:  Supple; no masses or thyromegaly. No significant cervical adenopathy. Lungs:  Clear throughout to auscultation.   No wheezes, crackles, or rhonchi. No acute distress. Heart:  Regular rate and rhythm; no murmurs, clicks, rubs,  or gallops. Abdomen: Non-distended, normal bowel sounds.  Soft and nontender without appreciable mass or hepatosplenomegaly.  Pulses:  Normal pulses noted. Extremities:  Without clubbing or edema.

## 2011-12-09 NOTE — Op Note (Signed)
[facility] [address] [place], [place]  [address]  COLONOSCOPY PROCEDURE REPORT  PATIENT:  [name], Michelle Rich  MR#:  [id number] BIRTHDATE:  1956/03/05, 56 yrs. old  GENDER:  female ENDOSCOPIST:  R. Roetta Sessions, MD FACP [facility] REF. BY:  Renaldo Harrison, D.O PROCEDURE DATE:  12/09/2011 PROCEDURE:  Colonoscopy with biopsy, stool sample  INDICATIONS:  Diarrhea; adenomatous changes on prior biopsies of what appeared to be otherwise normal colonic mucosa.  INFORMED CONSENT:  The risks, benefits, alternatives and imponderables including but not limited to bleeding, perforation as well as the possibility of a missed lesion have been reviewed. The potential for biopsy, lesion removal, etc. have also been discussed.  Questions have been answered.  All parties agreeable. Please see the history and physical in the medical record for more information.  MEDICATIONS:  Versed 7 mg IV 125 mg IV in divided doses.  DESCRIPTION OF PROCEDURE:  After a digital rectal exam was performed, the EC-[id number] ([id number]) colonoscope was advanced from the anus through the rectum and colon to the area of the cecum, ileocecal valve and appendiceal orifice.  The cecum was deeply intubated.  These structures were well-seen and photographed for the record.  From the level of the cecum and ileocecal valve, the scope was slowly and cautiously withdrawn.  The mucosal surfaces were carefully surveyed utilizing scope tip deflection to facilitate fold flattening as needed.  The scope was pulled down into the rectum where a thorough examination including retroflexion was performed. <<PROCEDUREIMAGES>>  FINDINGS:  Suboptimal but improved prep over that seen previously. Normal rectum. Scattered left-sided diverticula. Single diminutive polyp in the mid descending segment; otherwise the remainder of the colonic mucosa the cecum appeared normal.  THERAPEUTIC / DIAGNOSTIC MANEUVERS PERFORMED:  Segmental biopsies of  the ascending, transverse, descending, sigmoid and rectal segments taken for histologic study. The mid descending colon polyps cold biopsied/removed. A stool sample was obtained.  COMPLICATIONS:  None  CECAL WITHDRAWAL TIME:  13 minutes  IMPRESSION: Colonic diverticulosis and polyp-removed described above. Segmental biopsies and stool sample obtained.  RECOMMENDATIONS: Followup on pathology. Send stool for qualitative fecal fat analysis and GPP assay  ______________________________ R. Roetta Sessions, MD Caleen Essex  CC:  Renaldo Harrison, D.O  n. eSIGNED:   R. Roetta Sessions at 12/09/2011 08:16 AM  Modena Nunnery, [phone]

## 2011-12-09 NOTE — Interval H&P Note (Signed)
History and Physical Interval Note:  12/09/2011 7:43 AM  Michelle Rich  has presented today for surgery, with the diagnosis of HX OF POLYPS  The various methods of treatment have been discussed with the patient and family. After consideration of risks, benefits and other options for treatment, the patient has consented to  Procedure(s) (LRB): COLONOSCOPY (N/A) as a surgical intervention .  The patients' history has been reviewed, patient examined, no change in status, stable for surgery.  I have reviewed the patients' chart and labs.  Questions were answered to the patient's satisfaction.     [name]

## 2011-12-10 LAB — FECAL FAT, QUALITATIVE: Free fatty acids: NORMAL

## 2011-12-12 ENCOUNTER — Encounter: Payer: Self-pay | Admitting: Internal Medicine

## 2011-12-13 LAB — MISCELLANEOUS TEST

## 2011-12-15 ENCOUNTER — Encounter (HOSPITAL_COMMUNITY): Payer: Self-pay | Admitting: Internal Medicine

## 2012-01-03 ENCOUNTER — Telehealth: Payer: Self-pay | Admitting: Internal Medicine

## 2012-01-03 MED ORDER — DEXLANSOPRAZOLE 60 MG PO CPDR: 60.0000 mg | DELAYED_RELEASE_CAPSULE | Freq: Every day | ORAL | Status: DC

## 2012-01-03 NOTE — Telephone Encounter (Signed)
Please call patient back at [phone]. She has questions regarding her medications

## 2012-01-03 NOTE — Telephone Encounter (Signed)
Trial Dexilant

## 2012-01-03 NOTE — Telephone Encounter (Signed)
Addended by: Joselyn Arrow on: 01/03/2012 03:49 PM   Modules accepted: Orders

## 2012-01-03 NOTE — Telephone Encounter (Signed)
Spoke with pt- the protonix bid is not working. She has tried nexium and prilosec as well and they didn't work either. Pt is requesting something different called in. She is aware that we may have to do a PA for her insurance and that it might take a few days to be complete. [facility] uses [facility] which is [facility] in epic.

## 2012-02-08 ENCOUNTER — Other Ambulatory Visit: Payer: Self-pay

## 2012-02-08 MED ORDER — PROMETHAZINE HCL 25 MG PO TABS: 25.0000 mg | ORAL_TABLET | Freq: Four times a day (QID) | ORAL | Status: AC | PRN

## 2012-05-18 ENCOUNTER — Other Ambulatory Visit: Payer: Self-pay

## 2012-05-18 ENCOUNTER — Other Ambulatory Visit: Payer: Self-pay | Admitting: Gastroenterology

## 2012-05-18 MED ORDER — PANCRELIPASE (LIP-PROT-AMYL) 36000-114000 UNITS PO CPEP: 2.0000 | ORAL_CAPSULE | Freq: Three times a day (TID) | ORAL | Status: DC

## 2012-05-18 MED ORDER — PANCRELIPASE (LIP-PROT-AMYL) 12000-38000 UNITS PO CPEP: 1.0000 | ORAL_CAPSULE | Freq: Three times a day (TID) | ORAL | Status: DC

## 2012-07-10 ENCOUNTER — Other Ambulatory Visit: Payer: Self-pay

## 2012-07-10 MED ORDER — DEXLANSOPRAZOLE 60 MG PO CPDR: 60.0000 mg | DELAYED_RELEASE_CAPSULE | Freq: Every day | ORAL | Status: DC

## 2012-11-30 ENCOUNTER — Other Ambulatory Visit: Payer: Self-pay

## 2012-11-30 MED ORDER — PANCRELIPASE (LIP-PROT-AMYL) 36000-114000 UNITS PO CPEP: 2.0000 | ORAL_CAPSULE | Freq: Three times a day (TID) | ORAL | Status: DC

## 2013-03-07 ENCOUNTER — Other Ambulatory Visit: Payer: Self-pay | Admitting: Urgent Care

## 2013-07-04 ENCOUNTER — Other Ambulatory Visit: Payer: Self-pay

## 2013-07-04 MED ORDER — DEXLANSOPRAZOLE 60 MG PO CPDR: DELAYED_RELEASE_CAPSULE | ORAL | Status: DC

## 2013-10-04 ENCOUNTER — Other Ambulatory Visit: Payer: Self-pay

## 2013-10-04 MED ORDER — PANCRELIPASE (LIP-PROT-AMYL) 36000-114000 UNITS PO CPEP: 2.0000 | ORAL_CAPSULE | Freq: Three times a day (TID) | ORAL | Status: AC

## 2014-01-21 ENCOUNTER — Other Ambulatory Visit: Payer: Self-pay | Admitting: *Deleted

## 2014-01-22 ENCOUNTER — Encounter: Payer: Self-pay | Admitting: Internal Medicine

## 2014-01-22 MED ORDER — DEXLANSOPRAZOLE 60 MG PO CPDR: DELAYED_RELEASE_CAPSULE | ORAL | Status: DC

## 2014-01-22 NOTE — Telephone Encounter (Signed)
Needs OV. Last seen 01/2012. Refill Dexilant X 1.

## 2014-01-22 NOTE — Telephone Encounter (Signed)
Pt is aware of OV on 7/27 at 0830 with [facility]SL and appt card mailed

## 2014-02-25 ENCOUNTER — Telehealth: Payer: Self-pay | Admitting: Internal Medicine

## 2014-02-25 ENCOUNTER — Ambulatory Visit: Payer: Medicare Other | Admitting: Gastroenterology

## 2014-02-25 NOTE — Telephone Encounter (Signed)
No further refills until patient is seen

## 2014-02-25 NOTE — Telephone Encounter (Signed)
Pt was a no show

## 2014-03-05 ENCOUNTER — Encounter: Payer: Self-pay | Admitting: Internal Medicine

## 2014-09-03 ENCOUNTER — Encounter: Payer: Self-pay | Admitting: Internal Medicine

## 2018-05-23 ENCOUNTER — Encounter: Payer: Self-pay | Admitting: Internal Medicine

## 2018-05-26 ENCOUNTER — Ambulatory Visit: Payer: Medicare Other | Admitting: Internal Medicine

## 2018-06-07 ENCOUNTER — Encounter: Payer: Self-pay | Admitting: Internal Medicine

## 2018-06-16 ENCOUNTER — Encounter: Payer: Self-pay | Admitting: Internal Medicine

## 2018-06-16 ENCOUNTER — Ambulatory Visit (INDEPENDENT_AMBULATORY_CARE_PROVIDER_SITE_OTHER): Payer: 59 | Admitting: Internal Medicine

## 2018-06-16 ENCOUNTER — Other Ambulatory Visit: Payer: Self-pay

## 2018-06-16 VITALS — BP 116/66 | HR 79 | Temp 97.4°F | Ht 65.0 in | Wt 249.4 lb

## 2018-06-16 DIAGNOSIS — Z86010 Personal history of colonic polyps: Secondary | ICD-10-CM

## 2018-06-16 DIAGNOSIS — R109 Unspecified abdominal pain: Secondary | ICD-10-CM

## 2018-06-16 DIAGNOSIS — K219 Gastro-esophageal reflux disease without esophagitis: Secondary | ICD-10-CM

## 2018-06-16 DIAGNOSIS — Z8719 Personal history of other diseases of the digestive system: Secondary | ICD-10-CM

## 2018-06-16 DIAGNOSIS — R197 Diarrhea, unspecified: Secondary | ICD-10-CM | POA: Diagnosis not present

## 2018-06-16 LAB — BUN/CREATININE RATIO
BUN / CREAT RATIO: 23 (calc) — AB (ref 6–22)
BUN: 32 mg/dL — ABNORMAL HIGH (ref 7–25)
Creat: 1.4 mg/dL — ABNORMAL HIGH (ref 0.50–0.99)
GFR, Est African American: 46 mL/min/{1.73_m2} — ABNORMAL LOW (ref >=60)
GFR, Est Non African American: 40 mL/min/{1.73_m2} — ABNORMAL LOW (ref >=60)

## 2018-06-16 NOTE — Patient Instructions (Addendum)
   Pancreatic protocol CT (abd and Pelvis) - hx of chronic pancreatitis, abdominal pain  Need records regarding EGD /TCS done at [facility] previously - Dr. Anthony Sar  Further recommendations to follow

## 2018-06-16 NOTE — Progress Notes (Signed)
Primary Care Physician:  Michell Heinrich, DO Primary Gastroenterologist:  Dr.   Pre-Procedure History & Physical: HPI:  Michelle Rich is a 62 y.o. female here after a  long absence from our practice.  Had a bout of left flank pain recently felt it was her "pancreas".  Appointment made here.  Long-standing GERD for which she takes Dexilant 30 mg daily.  Status post lap Nissen several years ago.  History of Barrett's esophagus.  History of colonic adenomas.  We were seeing her for intermittent diarrhea and right-sided abdominal pain in 2013.  Nausea but unable to vomit.  GES normal at that time.  Describes 5-6 greasy bowel movements daily no nocturnal diarrhea.  May go a week without a bowel movement.  Has been taking Creon 24 2 capsules daily with 2 meals that she typically consumes daily.   No recent Stager-sectional imaging.  Patient denies weight loss.  EGD and colonoscopy over the years at [facility].  Denies fever chills, gross hematuria.  Melena or hematochezia EGD and colonoscopy here in the distant past.  Small bowel biopsies: Biopsies negative for celiac or microscopic colitis, respectively. No history of alcohol use.  No family history of pancreatic disease.  Past Medical History:  Diagnosis Date  . Adenoma of large intestine    "flat"  . Asthma   . Chronic fatigue syndrome   . Constipation   . Diarrhea   . Diverticulosis   . Fibromyalgia   . GERD (gastroesophageal reflux disease)    s/p Nissen  . Hypercholesteremia   . Hypertension   . Hypothyroidism   . IBS (irritable bowel syndrome)   . Migraines   . S/P colonoscopy    2007: internal/external hemorrhoids, otherwise normal by Dr. Debby Bud. June 2012:normal but biopsy with focal acute colitis, non-specific by Dr. Rowe Pavy   . S/P endoscopy 2009, 2012   normal, small bowel biopsy negative, giardia negative, celiac negative, 2012: reflux esophagitis, benign bx, negative celiac  . Shortness of breath     Past Surgical History:    Procedure Laterality Date  . ABDOMINAL HYSTERECTOMY    . CARPAL TUNNEL RELEASE     bilateral x2  . CHOLECYSTECTOMY    . COLONOSCOPY  09/02/2011   Dr. Danford Bad adenomas  . COLONOSCOPY  12/09/2011   Dr. Gala Romney- colonic diverticulosis, bx= benign colonic mucosa  . ELBOW SURGERY     right  . ESOPHAGOGASTRODUODENOSCOPY  05/11/2011   Dr. Gala Romney- benign mucosa  . HERNIA REPAIR    . Nissen    . RHINOPLASTY    . 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  Marland Kitchen steel rods and buttons for abdominal wound    . TONSILLECTOMY    . TRIGGER FINGER RELEASE     bilateral  . TUBAL LIGATION    . Umbilical Herniorrhapy      Prior to Admission medications   Medication Sig Start Date End Date Taking? Authorizing Provider  ALPRAZolam Duanne Moron) 0.5 MG tablet Take 0.5 mg by mouth 3 (three) times daily as needed. Sleep/Anxiety   Yes [provider]  atenolol (TENORMIN) 25 MG tablet Take 25 mg by mouth daily.   Yes [provider]  Cholecalciferol 50 MCG (2000 UT) TABS Take 1 tablet by mouth daily.    Yes [provider]  Dexlansoprazole (DEXILANT) 30 MG capsule Take 30 mg by mouth daily.   Yes [provider]  dextroamphetamine (DEXEDRINE SPANSULE) 15  MG 24 hr capsule Take 15 mg by mouth daily.     Yes [provider]  FLUoxetine (PROZAC) 20 MG capsule Take 60 mg by mouth daily.    Yes [provider]  fluticasone (FLONASE) 50 MCG/ACT nasal spray Place 2 sprays into both nostrils daily.   Yes [provider]  Fluticasone-Salmeterol (ADVAIR DISKUS) 250-50 MCG/DOSE AEPB Inhale 1 puff into the lungs daily.    Yes [provider]  levothyroxine (SYNTHROID, LEVOTHROID) 88 MCG tablet Take 150 mcg by mouth daily.    Yes [provider]  omega-3 acid ethyl esters (LOVAZA) 1 G capsule Take 2 g by mouth 2 (two) times daily.     Yes [provider]  Pancrelipase, Lip-Prot-Amyl, (CREON)  36000 UNITS CPEP Take 2 capsules by mouth 3 (three) times daily with meals. Take 1 with snacks. Patient taking differently: Take 2 capsules by mouth 2 (two) times daily. Take 1 with snacks. 10/04/13  Yes Annitta Needs, NP  primidone (MYSOLINE) 50 MG tablet Take 1 tablet by mouth Three times a day. 09/27/11  Yes [provider]  promethazine (PHENERGAN) 25 MG tablet Take 1 tablet (25 mg total) by mouth every 6 (six) hours as needed. for nausea/vomiting Patient taking differently: Take 50 mg by mouth every 6 (six) hours as needed. for nausea/vomiting 02/08/12  Yes Mahala Menghini, PA-C  rizatriptan (MAXALT) 10 MG tablet Take 10 mg by mouth as needed for migraine. May repeat in 2 hours if needed   Yes [provider]  simvastatin (ZOCOR) 80 MG tablet Take 40 mg by mouth at bedtime.    Yes [provider]  telmisartan-hydrochlorothiazide (MICARDIS HCT) 80-12.5 MG per tablet Take 1 tablet by mouth daily.     Yes [provider]  tiZANidine (ZANAFLEX) 4 MG tablet Take 4 mg by mouth 3 (three) times daily.   Yes [provider]  albuterol (PROVENTIL HFA;VENTOLIN HFA) 108 (90 BASE) MCG/ACT inhaler Inhale 2 puffs into the lungs every 6 (six) hours as needed. Asthma Symptoms     [provider]  butalbital-acetaminophen-caffeine (FIORICET WITH CODEINE) 50-325-40-30 MG per capsule Take 1 capsule by mouth every 4 (four) hours as needed. Headaches     [provider]  cholestyramine Lucrezia Starch) 4 G packet Take 1 packet by mouth daily.    [provider]  Cyanocobalamin (VITAMIN B-12 CR) 1000 MCG TBCR Take 2,000 mcg by mouth daily.     [provider]  CYANOCOBALAMIN PO Take 1 tablet by mouth daily. Strength 5000 mcg    [provider]  estrogens, conjugated, (PREMARIN) 1.25 MG tablet Take 1.25 mg by mouth daily.      [provider]  OnabotulinumtoxinA (BOTOX IJ) Inject 1 application as directed every 3 (three) months.  Receives injection for migraines, unknown dose    [provider]  pantoprazole (PROTONIX) 40 MG tablet Take 1 tablet (40 mg total) by mouth 2 (two) times daily. Patient not taking: Reported on 06/16/2018 05/11/11 05/10/12  Daneil Dolin, MD    Allergies as of 06/16/2018 - Review Complete 06/16/2018  Allergen Reaction Noted  . Other  04/27/2011  . Amitriptyline Rash 02/24/2011  . Aspirin Rash 02/24/2011  . Codeine Rash   . Morphine and related Rash 02/24/2011  . Nsaids Rash 04/27/2011  . Penicillins Rash   . Propoxyphene n-acetaminophen Rash   . Sulfa antibiotics Rash 02/24/2011    Family History  Problem Relation Age of Onset  . Colon cancer  Maternal Grandfather        deceased age 17  . Ulcerative colitis Father        ? question of UC  . Anesthesia problems Neg Hx   . Hypotension Neg Hx   . Malignant hyperthermia Neg Hx   . Pseudochol deficiency Neg Hx     Social History   Socioeconomic History  . Marital status: Legally Separated    Spouse name: Not on file  . Number of children: Not on file  . Years of education: Not on file  . Highest education level: Not on file  Occupational History  . Not on file  Social Needs  . Financial resource strain: Not on file  . Food insecurity:    Worry: Not on file    Inability: Not on file  . Transportation needs:    Medical: Not on file    Non-medical: Not on file  Tobacco Use  . Smoking status: Former Smoker    Packs/day: 1.00    Years: 40.00    Pack years: 40.00    Types: Cigarettes    Last attempt to quit: 09/01/2006    Years since quitting: 11.7  . Smokeless tobacco: Never Used  . Tobacco comment: quit 3 years ago  Substance and Sexual Activity  . Alcohol use: No  . Drug use: No  . Sexual activity: Yes    Birth control/protection: Surgical  Lifestyle  . Physical activity:    Days per week: Not on file    Minutes per session: Not on file  . Stress: Not on file  Relationships  . Social connections:      Talks on phone: Not on file    Gets together: Not on file    Attends religious service: Not on file    Active member of club or organization: Not on file    Attends meetings of clubs or organizations: Not on file    Relationship status: Not on file  . Intimate partner violence:    Fear of current or ex partner: Not on file    Emotionally abused: Not on file    Physically abused: Not on file    Forced sexual activity: Not on file  Other Topics Concern  . Not on file  Social History Narrative  . Not on file    Review of Systems: See HPI, otherwise negative ROS  Physical Exam: BP 116/66   Pulse 79   Temp (!) 97.4 F (36.3 C) (Oral)   Ht 5\' 5"  (1.651 m)   Wt 249 lb 6.4 oz (113.1 kg)   BMI 41.50 kg/m  General:   Alert, lightly disheveled, pleasant and cooperative in NAD Neck:  Supple; no masses or thyromegaly. No significant cervical adenopathy. Lungs:  Clear throughout to auscultation.   No wheezes, crackles, or rhonchi. No acute distress. Heart:  Regular rate and rhythm; no murmurs, clicks, rubs,  or gallops. Abdomen: Obese., normal bowel sounds.  Soft and nontender without appreciable mass or hepatosplenomegaly.  Pulses:  Normal pulses noted.  No CVA tenderness Extremities:  Without clubbing or edema. Rectal exam: Formed, somewhat firm stool in rectal vault no mass.  Brown stool Hemoccult negative  Labs from April of this year through Dr. Marveen Reeks' office  -  CBC entirely normal, electrolytes also normal LFTs normal   Impression/Plan: Pleasant 62 year old lady presents recent self-limiting left flank pain.  Along the way, sounds like she is been treated empirically for chronic pancreatitis.  Her bowel function does not  sound like chronic pancreatitis.  More like irritable bowel syndrome.  I doubt left flank pain is related to her pancreas or other GI pathology.  Reported history of pancreatitis may be a dubious diagnosis.  No doubt she has a long history of GERD requiring  a daily PPI -  status post laparoscopic Nissen fundoplication previously.  History of colonic polyp.  Evaluation for microscopic colitis and celiac disease previously negative.  Etiology of her left flank pain not at all clear at this time.  I suppose she could have passed a kidney stone or could be musculoskeletal in etiology.  Hx of multiple EGDs and colonoscopies along the way reported history of Barrett's esophagus and colonic adenomas  Recommendations:  We will go ahead and proceed with [place]-sectional imaging.  Need to gather additional data this out there to I do not have for review before making further evaluation regarding evaluation and management.-   Pancreatic protocol CT (abd and Pelvis) - hx of chronic pancreatitis, abdominal pain  Need records regarding EGD /TCS done at [facility] previously - Dr. Anthony Sar  Further recommendations to follow   Addendum: Medical records from [facility] have arrived after patient left.  Last EGD reported August 22, 2003 by Dr. Anthony Sar.  Normal esophagus;  intact somewhat tight wrap otherwise negative findings aside from mild inflammatory changes in the stomach felt to be related to bile.  Last colonoscopy July 11, 2007. " Unexplained diarrhea".  Normal colonoscopy.  No biopsies taken.  TI  Noted not intubated.   Notice: This dictation was prepared with Dragon dictation along with smaller phrase technology. Any transcriptional errors that result from this process are unintentional and may not be corrected upon review.

## 2018-06-16 NOTE — Patient Instructions (Signed)
No PA needed for CT abd/pelvis w/wo contrast per [facility] website.

## 2018-06-27 ENCOUNTER — Telehealth: Payer: Self-pay

## 2018-06-27 NOTE — Telephone Encounter (Signed)
Brandie at pre-service center called office. She states pt has [place] [facility] and [facility] abd/pelvis w/wo contrast scheduled for 07/04/18 needs PA. Brandie advised phone number to call is [phone].   Called [place]. Case is pending eligibility confirmation. If confirmed eligible case will be reviewed clinically. To receive fax with information. Fax clinical notes to [phone]. Tracking# [id number].

## 2018-06-27 NOTE — Telephone Encounter (Signed)
Received fax from [place] ([place]): NIA has received information from [place] [employer] [place] that the [employer] eligibility for [employer] has changed and NIA is not contracted for authorization processing of that plan. The request indicated above has been withdrawn. Please contact [place] if you have questions regarding the member's eligibility.  [place] [facility] [place] and spoke to [place]. Informed her of fax received from NIA. Jasmine contacted NIA via 3 way conversation for clarification of fax. NIA had spoke to [place] at [place] who informed NIA that member wasn't found in the system. After verifying pt's info, NIA realized that pt's middle initial "H" had been omitted on the request. NIA was then able to verify pt eligibility using her middle initial with first and last name. Case resubmitted. [facility] is out of network. [facility] was chose for continuity of care. Case pending for facility verification. Tracking# [id number]. Clinical notes faxed to [phone]. Our office will receive notification of approval or additional info needed.

## 2018-06-28 ENCOUNTER — Other Ambulatory Visit: Payer: Self-pay

## 2018-06-28 NOTE — Telephone Encounter (Signed)
Received fax from [place] requesting clinical notes. Clinical notes faxed.

## 2018-07-03 NOTE — Telephone Encounter (Signed)
Pt called office, informed of CT being cancelled pending authorization.

## 2018-07-03 NOTE — Telephone Encounter (Signed)
Called NIA to f/u on CT. Spoke to [place]. Case is still pending. They require 3 business days for review and up to 5 business days if physician review is needed. 11/28//19 and 06/30/18 were holidays for NIA. Call ref# [id number].  CT is scheduled for tomorrow and will need to be cancelled at this time d/t PA is pending. Tried to call pt, no answer, LMOVM for return call.

## 2018-07-03 NOTE — Telephone Encounter (Signed)
Called [facility], cancelled CT scheduled for tomorrow pending authorization. Tried to call pt again, went straight to VM, left detailed message and informed of CT being cancelled. Tried to call pt's emergency contact Gae Gallop), no answer, LMOVM for him to call office or try to contact pt and have her call our office.

## 2018-07-04 ENCOUNTER — Ambulatory Visit (HOSPITAL_COMMUNITY): Payer: 59

## 2018-07-04 NOTE — Telephone Encounter (Addendum)
Received records from PCP office with lab results dated 06/08/18 but no records to support chronic pancreatitis. Tried to call pt, no answer, LMOVM for return call.

## 2018-07-04 NOTE — Telephone Encounter (Addendum)
Called PCP office and spoke to [place]. Requested additional clinical notes/results to support dx: chronic pancreatitis. Our office fax number was given to her.

## 2018-07-04 NOTE — Telephone Encounter (Signed)
See office note from 11/15.  Patient has a history of abdominal pain flank pain and chronic pancreatitis.  Sensible evaluation elsewhere.  We do not have records.

## 2018-07-04 NOTE — Telephone Encounter (Signed)
Received fax from [place]. CT was denied d/t: Reason of flank pain is unable to be approved. Before they can approve the request the following notes should be given: doctor's notes that say other tests such as blood, urine, x-rays with or without dye, ultrasound, or scope tests have been done. Those tests must also show why this test is still needed.

## 2018-07-05 NOTE — Telephone Encounter (Signed)
Tried to call pt to see if she is able to provide any additional information re: chronic pancreatitis, no answer, LMOVM for return call.

## 2018-07-07 NOTE — Telephone Encounter (Signed)
Tried to call pt, no answer, LMOVM for return call.  

## 2018-07-10 NOTE — Telephone Encounter (Signed)
Requested labs from [facility] and any records related to pancreatitis from Dr Marveen Reeks' office

## 2018-07-10 NOTE — Telephone Encounter (Signed)
Pt called office. She has only had tests done with PCP related to pancreatitis. Stated she had blood work done in October. Hasn't had any imaging.   Manuela Schwartz, can you try to get blood work from PCP office done in October and any records related to pancreatitis. (the labs I received were from November and not related to pancreatitis)

## 2018-07-12 NOTE — Telephone Encounter (Signed)
Records received from PCP. Labs dated 06/08/18. No results received to support chronic pancreatitis. WBC 8.1. Results sent to be scanned into chart.   Dr. Gala Romney, would you like to order any labs to check pancreas?

## 2018-07-20 NOTE — Telephone Encounter (Signed)
Pt called office, informed of RMR recommendation and appt.

## 2018-07-20 NOTE — Telephone Encounter (Signed)
Tried to call pt, no answer. OV scheduled. Letter mailed.

## 2018-07-20 NOTE — Telephone Encounter (Signed)
No; not at this time; needs OV w [place] in next several weeks

## 2018-08-17 ENCOUNTER — Encounter: Payer: Self-pay | Admitting: *Deleted

## 2018-08-17 ENCOUNTER — Ambulatory Visit (INDEPENDENT_AMBULATORY_CARE_PROVIDER_SITE_OTHER): Payer: 59 | Admitting: Gastroenterology

## 2018-08-17 ENCOUNTER — Encounter: Payer: Self-pay | Admitting: Gastroenterology

## 2018-08-17 VITALS — BP 134/68 | HR 67 | Temp 96.8°F | Ht 65.0 in | Wt 242.2 lb

## 2018-08-17 DIAGNOSIS — R1310 Dysphagia, unspecified: Secondary | ICD-10-CM | POA: Insufficient documentation

## 2018-08-17 DIAGNOSIS — R1319 Other dysphagia: Secondary | ICD-10-CM | POA: Insufficient documentation

## 2018-08-17 DIAGNOSIS — K219 Gastro-esophageal reflux disease without esophagitis: Secondary | ICD-10-CM

## 2018-08-17 DIAGNOSIS — K582 Mixed irritable bowel syndrome: Secondary | ICD-10-CM

## 2018-08-17 DIAGNOSIS — R1012 Left upper quadrant pain: Secondary | ICD-10-CM

## 2018-08-17 DIAGNOSIS — K921 Melena: Secondary | ICD-10-CM | POA: Insufficient documentation

## 2018-08-17 DIAGNOSIS — Q453 Other congenital malformations of pancreas and pancreatic duct: Secondary | ICD-10-CM | POA: Insufficient documentation

## 2018-08-17 MED ORDER — DEXLANSOPRAZOLE 60 MG PO CPDR: 60.0000 mg | DELAYED_RELEASE_CAPSULE | Freq: Every day | ORAL | 3 refills | Status: DC

## 2018-08-17 NOTE — Progress Notes (Signed)
Primary Care Physician: Michell Heinrich, DO  Primary Gastroenterologist:  Garfield Cornea, MD   Chief Complaint  Patient presents with  . Melena    x3 days with mucous    HPI: Michelle Rich is a 63 y.o. female here for follow-up.  She was last seen in the office in November 2019 after a long absence from our practice.  She presented with left flank pain which she felt may be related to her pancreas.  She also has a history of longstanding GERD, status post lap Nissen several years ago.  History of Barrett's esophagus?Marland Kitchen  History of colonic adenomas.  In 2013 we saw her for intermittent diarrhea and right-sided abdominal pain.  She had nausea as well but no vomiting.  GES normal at that time.  5-6 greasy bowel movements daily but could go a week without a BM.  Has been on Creon for years, feels like it really does not seem to make much difference.  No significant weight loss.  EGD in October 2012 here showed distal esophageal erosions, intact fundoplication, erythematous small bowel, small bowel biopsies negative for celiac disease.  Colonoscopy in January 2013 showing suboptimal bowel prep, left-sided diverticulosis, normal TI, hemorrhoids, random colon biopsies negative for microscopic colitis but had several with adenomatous changes.  Repeat colonoscopy in May 2013 showed colonic diverticulosis, benign random colon biopsies.  Next colonoscopy in 5 years.    She had a EGD January 2005 by Dr. Anthony Sar.  Normal esophagus, intact somewhat tight wrap there was negative findings aside from mild inflammatory changes in the stomach felt to be related to bile.  Last colonoscopy at [facility] in December 2008 for unexplained diarrhea, normal colonoscopy with no biopsies taken, TI not intubated.  Patient had a CT abdomen pelvis with contrast at [facility] June 2012.  Showed previous cholecystectomy.  The body and tail of the pancreas essentially absent, likely representing remote pancreatic  trauma or atrophy associated with prior pancreatitis.  Can be associated with pancreatic insufficiency.  Spleen appeared normal.  She also had mild ascending colon mural thickening, most pronounced at the hepatic flexure.  Mild mural thickening at the splenic flexure and along the descending colon.  Patient continues to have left upper quadrant abdominal pain.  Pain goes around the left upper flank and up to the left chest wall.  Although pain is persistent, she notes that it is somewhat better than when she was here last.  She still feels like it is raw inside.  When she takes a deep breath she has tenderness like her ribs are touching something.  She continues to have alternating constipation and diarrhea, about 50% each.  Last few days she has had mostly constipation, stool is black.  No fresh blood per rectum.  No associated nausea, vomiting.  No fever.  She does have breakthrough heartburn/indigestion, solid food dysphagia.  Having breakthrough symptoms at least 2-3 times per week.  She takes Creon 2 with meals and 1 with snacks.  Again feels like it really has not improved her bowel function.  She is had no weight loss.  She is due for colonoscopy for history of adenomatous colon polyps.   Current Outpatient Medications  Medication Sig Dispense Refill  . albuterol (PROVENTIL HFA;VENTOLIN HFA) 108 (90 BASE) MCG/ACT inhaler Inhale 2 puffs into the lungs every 6 (six) hours as needed. Asthma Symptoms     . ALPRAZolam (XANAX) 0.5 MG tablet Take 0.5 mg by mouth 3 (three) times  daily as needed. Sleep/Anxiety    . atenolol (TENORMIN) 25 MG tablet Take 25 mg by mouth daily.    . Cholecalciferol 50 MCG (2000 UT) TABS Take 1 tablet by mouth daily.     Marland Kitchen Dexlansoprazole (DEXILANT) 30 MG capsule Take 30 mg by mouth daily.    Marland Kitchen dextroamphetamine (DEXEDRINE SPANSULE) 15 MG 24 hr capsule Take 15 mg by mouth daily.      Marland Kitchen FLUoxetine (PROZAC) 20 MG capsule Take 60 mg by mouth daily.     . fluticasone (FLONASE)  50 MCG/ACT nasal spray Place 2 sprays into both nostrils daily.    . Fluticasone-Salmeterol (ADVAIR DISKUS) 250-50 MCG/DOSE AEPB Inhale 1 puff into the lungs daily.     Marland Kitchen levothyroxine (SYNTHROID, LEVOTHROID) 88 MCG tablet Take 150 mcg by mouth daily.     Marland Kitchen omega-3 acid ethyl esters (LOVAZA) 1 G capsule Take 2 g by mouth 2 (two) times daily.      . OnabotulinumtoxinA (BOTOX IJ) Inject 1 application as directed every 3 (three) months. Receives injection for migraines, unknown dose    . Pancrelipase, Lip-Prot-Amyl, (CREON) 36000 UNITS CPEP Take 2 capsules by mouth 3 (three) times daily with meals. Take 1 with snacks. (Patient taking differently: Take 2 capsules by mouth 2 (two) times daily. Take 1 with snacks.) 240 capsule 5  . primidone (MYSOLINE) 50 MG tablet Take 1 tablet by mouth Three times a day.    . promethazine (PHENERGAN) 25 MG tablet Take 1 tablet (25 mg total) by mouth every 6 (six) hours as needed. for nausea/vomiting (Patient taking differently: Take 50 mg by mouth every 6 (six) hours as needed. for nausea/vomiting) 20 tablet 0  . simvastatin (ZOCOR) 80 MG tablet Take 40 mg by mouth at bedtime.     Marland Kitchen telmisartan-hydrochlorothiazide (MICARDIS HCT) 80-12.5 MG per tablet Take 1 tablet by mouth daily.      Marland Kitchen tiZANidine (ZANAFLEX) 4 MG tablet Take 4 mg by mouth 3 (three) times daily.     No current facility-administered medications for this visit.     Allergies as of 08/17/2018 - Review Complete 08/17/2018  Allergen Reaction Noted  . Other  04/27/2011  . Amitriptyline Rash 02/24/2011  . Aspirin Rash 02/24/2011  . Codeine Rash   . Morphine and related Rash 02/24/2011  . Nsaids Rash 04/27/2011  . Penicillins Rash   . Propoxyphene n-acetaminophen Rash   . Sulfa antibiotics Rash 02/24/2011   Past Medical History:  Diagnosis Date  . Adenoma of large intestine    "flat"  . Asthma   . Chronic fatigue syndrome   . Constipation   . Diarrhea   . Diverticulosis   . Fibromyalgia   .  GERD (gastroesophageal reflux disease)    s/p Nissen  . Hypercholesteremia   . Hypertension   . Hypothyroidism   . IBS (irritable bowel syndrome)   . Migraines   . S/P colonoscopy    2007: internal/external hemorrhoids, otherwise normal by Dr. Debby Bud. June 2012:normal but biopsy with focal acute colitis, non-specific by Dr. Rowe Pavy   . S/P endoscopy 2009, 2012   normal, small bowel biopsy negative, giardia negative, celiac negative, 2012: reflux esophagitis, benign bx, negative celiac  . Shortness of breath    Past Surgical History:  Procedure Laterality Date  . ABDOMINAL HYSTERECTOMY    . CARPAL TUNNEL RELEASE     bilateral x2  . CHOLECYSTECTOMY    . COLONOSCOPY  09/02/2011   Dr. Charmayne Sheer prep, left-sided diverticulosis, normal terminal ileum,  internal hemorrhoids.  Random colon biopsies needed for microscopic colitis but several showed adenomatous changes.    . COLONOSCOPY  12/09/2011   Dr. Ok Edwards diverticulosis, bx= benign colonic mucosa.  Next colonoscopy 5 years  . ELBOW SURGERY     right  . ESOPHAGOGASTRODUODENOSCOPY  05/11/2011   Dr. Volney American esophageal erosions, intact fundoplication, erythematous small bowel.  Small bowel biopsies negative for celiac  . HERNIA REPAIR    . Nissen    . RHINOPLASTY    . 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  Marland Kitchen steel rods and buttons for abdominal wound    . TONSILLECTOMY    . TRIGGER FINGER RELEASE     bilateral  . TUBAL LIGATION    . Umbilical Herniorrhapy     Family History  Problem Relation Age of Onset  . Colon cancer Maternal Grandfather        deceased age 58  . Ulcerative colitis Father        ? question of UC  . Anesthesia problems Neg Hx   . Hypotension Neg Hx   . Malignant hyperthermia Neg Hx   . Pseudochol deficiency Neg Hx    Social History   Tobacco Use  . Smoking status: Former Smoker    Packs/day: 1.00    Years: 40.00    Pack years: 40.00     Types: Cigarettes    Last attempt to quit: 09/01/2006    Years since quitting: 11.9  . Smokeless tobacco: Never Used  . Tobacco comment: quit 3 years ago  Substance Use Topics  . Alcohol use: No  . Drug use: No    ROS:  General: Negative for anorexia, weight loss, fever, chills, +fatigue, +weakness. ENT: Negative for hoarseness,  nasal congestion.+dysphagia CV: Negative for chest pain, angina, palpitations, dyspnea on exertion, peripheral edema.  Respiratory: Negative for dyspnea at rest, dyspnea on exertion, cough, sputum, wheezing.  GI: See history of present illness. GU:  Negative for dysuria, hematuria, urinary incontinence, urinary frequency, nocturnal urination.  Endo: Negative for unusual weight change.    Physical Examination:   BP 134/68   Pulse 67   Temp (!) 96.8 F (36 C) (Oral)   Ht 5\' 5"  (1.651 m)   Wt 242 lb 3.2 oz (109.9 kg)   BMI 40.30 kg/m   General: Well-nourished, well-developed in no acute distress.  Eyes: No icterus. Mouth: Oropharyngeal mucosa moist and pink , no lesions erythema or exudate. Lungs: Clear to auscultation bilaterally.  Heart: Regular rate and rhythm, no murmurs rubs or gallops.  Abdomen: Bowel sounds are normal,  Slightly distended, no hepatosplenomegaly or masses, no abdominal bruits or hernia , no rebound or guarding. Moderate tenderness with palpation in LUQ/left flank. Mild left lower rib tenderness. No CVA tenderness   Extremities: trace lower extremity edema. No clubbing or deformities. Neuro: Alert and oriented x 4   Skin: Warm and dry, no jaundice.   Psych: Alert and cooperative, normal mood and affect.

## 2018-08-17 NOTE — Patient Instructions (Signed)
1. Stop Dexilant 30 mg daily.  Start Dexilant 60 mg daily before breakfast.  New prescription sent to your pharmacy. 2. Please have your lab work done today.  When we get results back, we will try to get CT abdomen pelvis approved by your insurance. 3. After CT, we will work towards a colonoscopy and upper endoscopy with possible dilation of your esophagus for history of swallowing problems.

## 2018-08-18 ENCOUNTER — Telehealth: Payer: Self-pay | Admitting: *Deleted

## 2018-08-18 LAB — CBC WITH DIFFERENTIAL/PLATELET
Absolute Monocytes: 676 cells/uL (ref 200–950)
Basophils Absolute: 39 cells/uL (ref 0–200)
Basophils Relative: 0.4 %
Eosinophils Absolute: 196 cells/uL (ref 15–500)
Eosinophils Relative: 2 %
HEMATOCRIT: 39.7 % (ref 35.0–45.0)
Hemoglobin: 13.3 g/dL (ref 11.7–15.5)
Lymphs Abs: 3646 cells/uL (ref 850–3900)
MCH: 28.5 pg (ref 27.0–33.0)
MCHC: 33.5 g/dL (ref 32.0–36.0)
MCV: 85 fL (ref 80.0–100.0)
MPV: 9.9 fL (ref 7.5–12.5)
Monocytes Relative: 6.9 %
Neutro Abs: 5243 cells/uL (ref 1500–7800)
Neutrophils Relative %: 53.5 %
PLATELETS: 425 10*3/uL — AB (ref 140–400)
RBC: 4.67 10*6/uL (ref 3.80–5.10)
RDW: 12.3 % (ref 11.0–15.0)
Total Lymphocyte: 37.2 %
WBC: 9.8 10*3/uL (ref 3.8–10.8)

## 2018-08-18 LAB — COMPREHENSIVE METABOLIC PANEL
AG Ratio: 1.4 (calc) (ref 1.0–2.5)
ALT: 20 U/L (ref 6–29)
AST: 28 U/L (ref 10–35)
Albumin: 4.1 g/dL (ref 3.6–5.1)
Alkaline phosphatase (APISO): 93 U/L (ref 33–130)
BUN/Creatinine Ratio: 20 (calc) (ref 6–22)
BUN: 24 mg/dL (ref 7–25)
CO2: 26 mmol/L (ref 20–32)
Calcium: 9.6 mg/dL (ref 8.6–10.4)
Chloride: 101 mmol/L (ref 98–110)
Creat: 1.23 mg/dL — ABNORMAL HIGH (ref 0.50–0.99)
Globulin: 3 g/dL (calc) (ref 1.9–3.7)
Glucose, Bld: 110 mg/dL (ref 65–139)
Potassium: 4.6 mmol/L (ref 3.5–5.3)
Sodium: 135 mmol/L (ref 135–146)
Total Bilirubin: 0.4 mg/dL (ref 0.2–1.2)
Total Protein: 7.1 g/dL (ref 6.1–8.1)

## 2018-08-18 LAB — LIPASE: LIPASE: 27 U/L (ref 7–60)

## 2018-08-18 NOTE — Assessment & Plan Note (Signed)
Patient has a history of essentially absent body and tail of the pancreas on previous imaging study in 2012.  No prior history of pancreatic trauma to her knowledge.  Previously suspected pancreatic insufficiency, chronically on pancreatic enzymes.  Weight is stable.  Recently presenting with left upper quadrant pain severe initially, moderate at this point but persistent.  Left upper quadrant pain may be secondary to pancreatitis, peptic ulcer disease, splenomegaly or malignancy.  Cannot rule out complicating factors from previous Nissen fundoplication.  Given history of previous pancreatic abnormality on CT, persisting left upper quadrant pain would recommend further evaluation with repeat imaging.  Patient also has history of umbilical hernia repair with mesh placement, alternating constipation, diarrhea and will benefit from imaging of the pelvis as well.  Once CT completed, will pursue colonoscopy/EGD/ED for history of adenomatous colon polyps, breakthrough reflux, dysphagia. Deep sedation given previous requirements for high dose conscious sedation.  I have discussed the risks, alternatives, benefits with regards to but not limited to the risk of reaction to medication, bleeding, infection, perforation and the patient is agreeable to proceed. Written consent to be obtained.

## 2018-08-18 NOTE — Assessment & Plan Note (Signed)
Chronic alternating constipation and diarrhea, approximately 50% each.  History of suspected pancreatic insufficiency in the past with greasy stools, chronically on pancreatic enzymes.  No evidence of weight loss.  Suspect bowel issues related to IBS.  We will update colonoscopy in the near future for history of adenomatous colon polyps.  See assessment and plan for abdominal pain.

## 2018-08-18 NOTE — Assessment & Plan Note (Signed)
Reports black stool over the last few days.  She has been more constipated.  Doubt true melena.  Check labs.

## 2018-08-18 NOTE — Telephone Encounter (Signed)
See previous phone note regarding PA for out of state medicaid. I called [phone] and Spoke with Altha Harm in the [facility] department. She advised me patient has a medicare plan as primary and therefore no PA is required through secondary. She advised me that it will just be "voided" if I try to do PA through the medicaid since she has a medicare plan. I advised her that out precert center advised [place] that a PA is needed through the secondary and Altha Harm advised me we are unable to get precert through them as patient had medicare as primary. I asked for a ref# and she advised me to use her name and today's date [id number].

## 2018-08-18 NOTE — Assessment & Plan Note (Addendum)
Refractory reflux, breakthrough heartburn several times per week.  Complains of solid food dysphagia.  Increase Dexilant from 30 mg to 60 mg daily before breakfast.  Reinforced antireflux measures.  Consider upper endoscopy with esophageal dilation at time of colonoscopy in the near future.  See assessment and plan for abdominal pain.

## 2018-08-18 NOTE — Assessment & Plan Note (Deleted)
Patient has a history of essentially absent body and tail of the pancreas on previous imaging study in 2012.  No prior history of pancreatic trauma to her knowledge.  Previously suspected pancreatic insufficiency, chronically on pancreatic enzymes.  Weight is stable.  Recently presenting with left upper quadrant pain severe initially, moderate at this point but persistent.  Left upper quadrant pain may be secondary to pancreatitis, peptic ulcer disease, splenomegaly or malignancy.  Cannot rule out complicating factors from previous Nissen fundoplication.  Given history of previous pancreatic abnormality on CT, persisting left upper quadrant pain would recommend further evaluation with repeat imaging.  Patient also has history of umbilical hernia repair with mesh placement, alternating constipation, diarrhea and will benefit from imaging of the pelvis as well.

## 2018-08-21 NOTE — Progress Notes (Signed)
CC'D TO PCP °

## 2018-09-04 ENCOUNTER — Ambulatory Visit (HOSPITAL_COMMUNITY)
Admission: RE | Admit: 2018-09-04 | Discharge: 2018-09-04 | Disposition: A | Discharge status: Home / Self Care | Payer: 59 | Source: Ambulatory Visit | Attending: Gastroenterology | Admitting: Gastroenterology

## 2018-09-04 DIAGNOSIS — R1012 Left upper quadrant pain: Secondary | ICD-10-CM | POA: Insufficient documentation

## 2018-09-04 MED ORDER — IOHEXOL 300 MG/ML  SOLN
75.0000 mL | Freq: Once | INTRAMUSCULAR | Status: AC | PRN
  Administered 2018-09-04: 75 mL via INTRAVENOUS

## 2018-09-14 ENCOUNTER — Other Ambulatory Visit: Payer: Self-pay

## 2018-09-14 DIAGNOSIS — Z86010 Personal history of colonic polyps: Secondary | ICD-10-CM

## 2018-09-14 DIAGNOSIS — R1319 Other dysphagia: Secondary | ICD-10-CM

## 2018-09-14 DIAGNOSIS — R1310 Dysphagia, unspecified: Secondary | ICD-10-CM

## 2018-09-14 DIAGNOSIS — K219 Gastro-esophageal reflux disease without esophagitis: Secondary | ICD-10-CM

## 2018-09-14 MED ORDER — CLENPIQ 10-3.5-12 MG-GM -GM/160ML PO SOLN: 1.0000 | Freq: Once | ORAL | 0 refills | Status: AC

## 2018-11-02 ENCOUNTER — Telehealth: Payer: Self-pay

## 2018-11-02 NOTE — Telephone Encounter (Signed)
Tried to call pt to discuss TCS/EGD/DIL w/Propofol w/RMR that is for 11/16/18, (TCS will not be able to be done at this time d/t COVID-19 restrictions), no answer, LMOVM for return call.  MS had also tried to call pt yesterday afternoon and LMOVM for return call.

## 2018-11-02 NOTE — Telephone Encounter (Signed)
Patient called back and she wanted to r/s the whole procedure d/t COVID-19. She is now on for 6/8 at 12:15pm. Aware will mail new instructions and pre-op appt.

## 2018-11-13 ENCOUNTER — Other Ambulatory Visit (HOSPITAL_COMMUNITY): Payer: 59

## 2019-01-02 ENCOUNTER — Telehealth: Payer: Self-pay | Admitting: *Deleted

## 2019-01-02 NOTE — Telephone Encounter (Signed)
Carolyn from endo called. She spoke with patient on Friday 5/29 at 12pm. Patient wanted to cancel her procedure that was scheduled for 6/8 with RMR. She stated she had bronchitis and she would call us on Monday (6/1) to let us know. Patient did not call.   Called patient and LMOVM to see if she wanted to r/s. She was scheduled for TCS/EGD/DIL WITH PROPOFOL WITH RMR. Hoyle Sauer has orders in her depot until she hears back from [place].

## 2019-01-03 ENCOUNTER — Other Ambulatory Visit (HOSPITAL_COMMUNITY): Payer: 59

## 2019-01-03 NOTE — Telephone Encounter (Signed)
noted 

## 2019-01-03 NOTE — Telephone Encounter (Signed)
Lmovm. fowarding to [facility] as an [place]

## 2019-01-08 ENCOUNTER — Encounter (HOSPITAL_COMMUNITY): Admission: RE | Payer: Self-pay | Source: Home / Self Care

## 2019-01-08 ENCOUNTER — Ambulatory Visit (HOSPITAL_COMMUNITY): Admission: RE | Admit: 2019-01-08 | Payer: 59 | Source: Home / Self Care | Admitting: Internal Medicine

## 2019-01-08 SURGERY — COLONOSCOPY WITH PROPOFOL
Anesthesia: Monitor Anesthesia Care

## 2019-07-18 IMAGING — CT CT ABD-PELV W/ CM
2 of 5 series · 17 of 46 positions shown, 19 images · IV contrast (Isovue)
Comparison: None.

CLINICAL DATA: Abdominal pain, history of pancreatic abnormality,
left flank pain

EXAM:
CT ABDOMEN AND PELVIS WITH CONTRAST
TECHNIQUE: Multidetector CT imaging of the abdomen and pelvis was performed
using the standard protocol following bolus administration of
intravenous contrast.
CONTRAST:  75mL OMNIPAQUE IOHEXOL 300 MG/ML  SOLN

[Series 2: axial st · axial · 0.87mm/px · z∈[+956,+1366]mm · 14 of 94 slices shown, 16 images]
[im 6/94  soft-tissue]
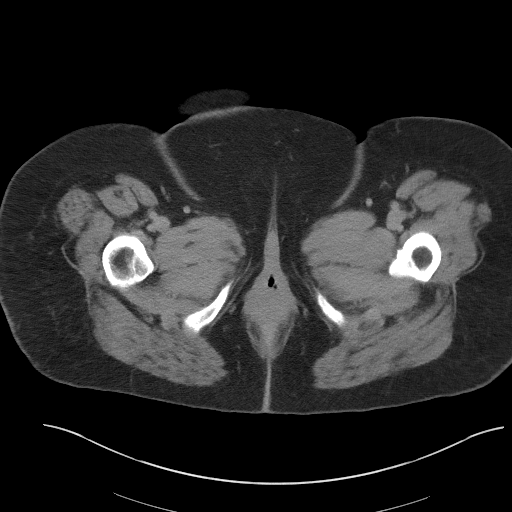
[im 6/94  bone]
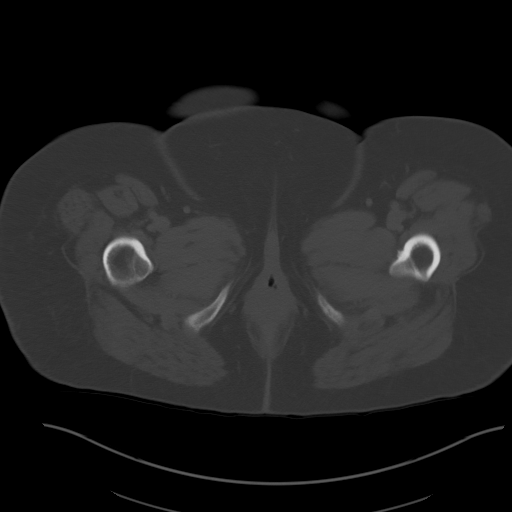
[im 11/94  soft-tissue]
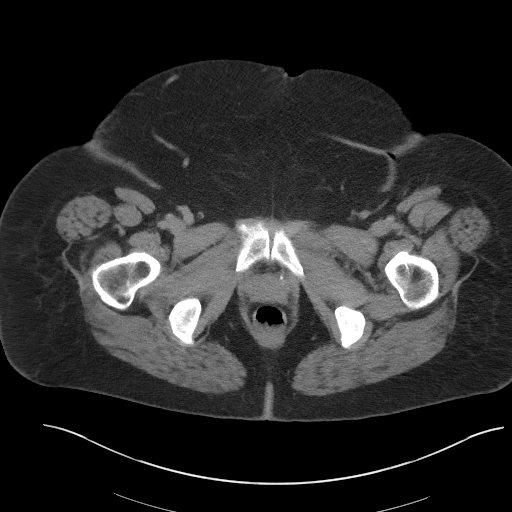
[im 17/94  soft-tissue]
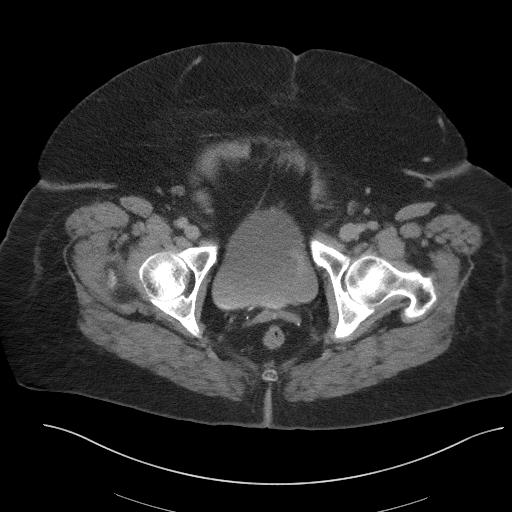
[im 28/94  soft-tissue]
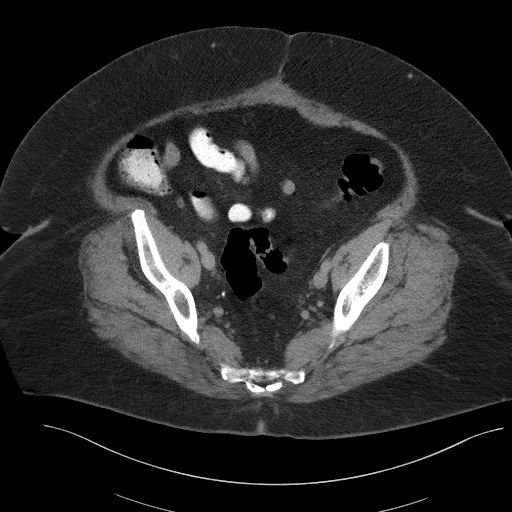
[im 33/94  soft-tissue]
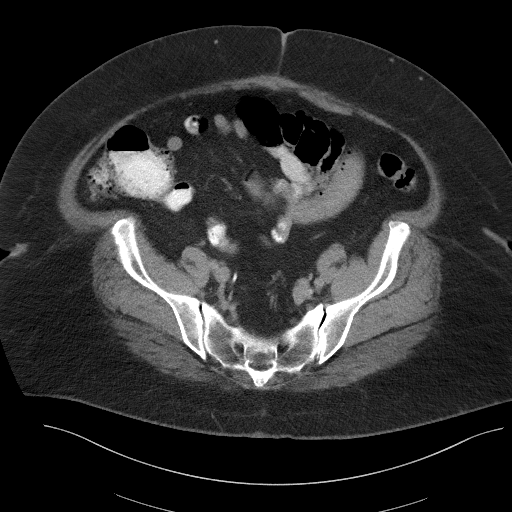
[im 39/94  soft-tissue]
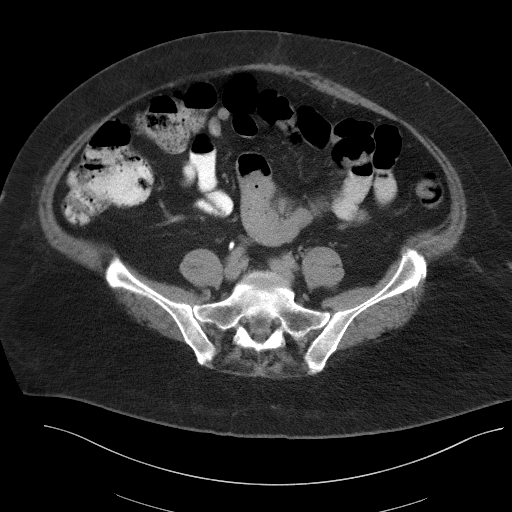
[im 44/94  soft-tissue]
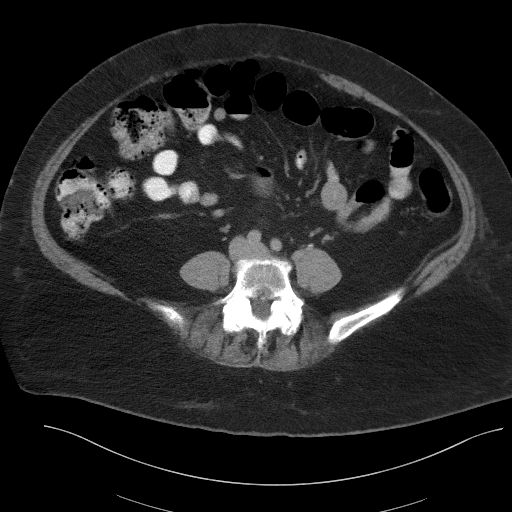
[im 50/94  soft-tissue]
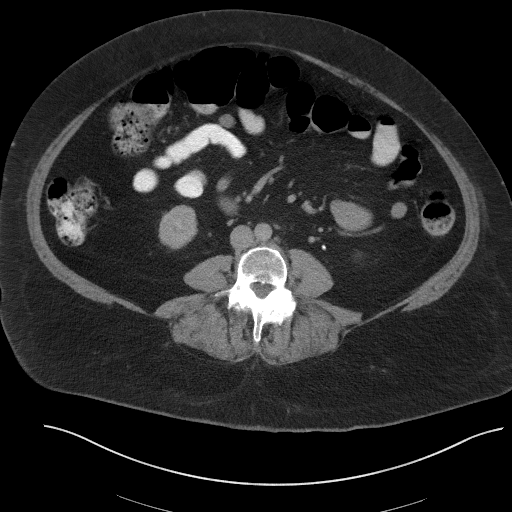
[im 55/94  soft-tissue]
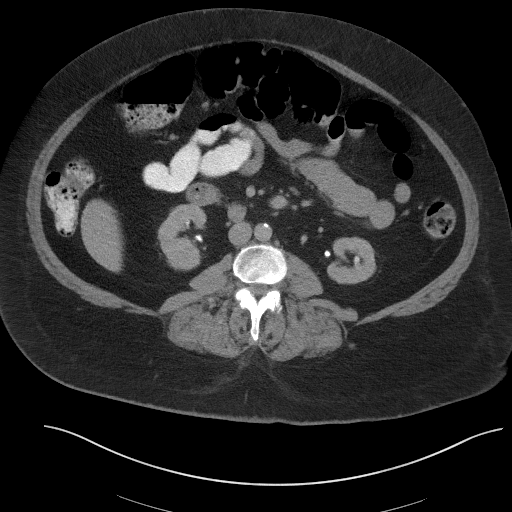
[im 55/94  bone]
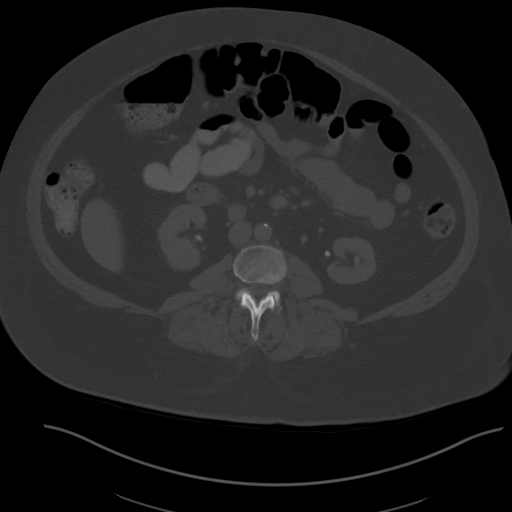
[im 61/94  soft-tissue]
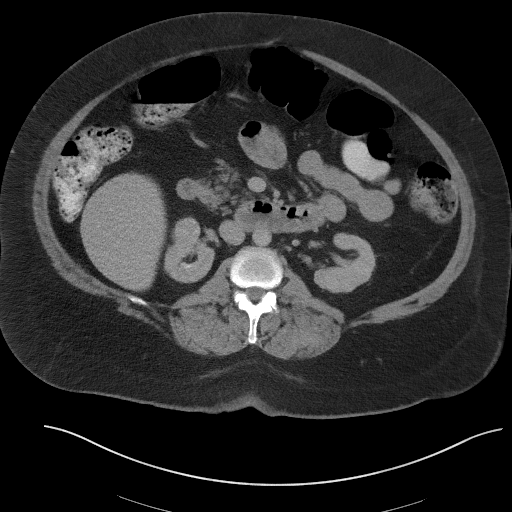
[im 72/94  soft-tissue]
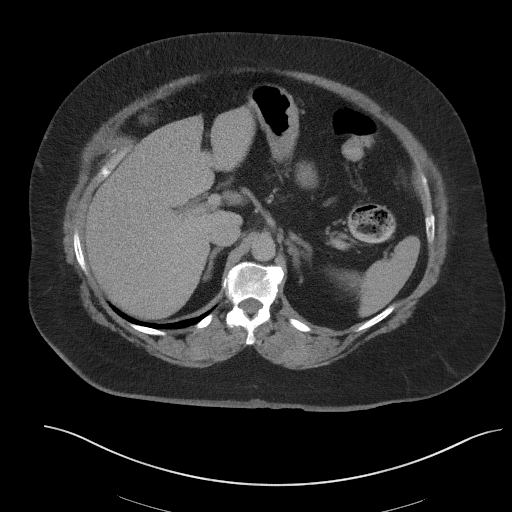
[im 77/94  soft-tissue]
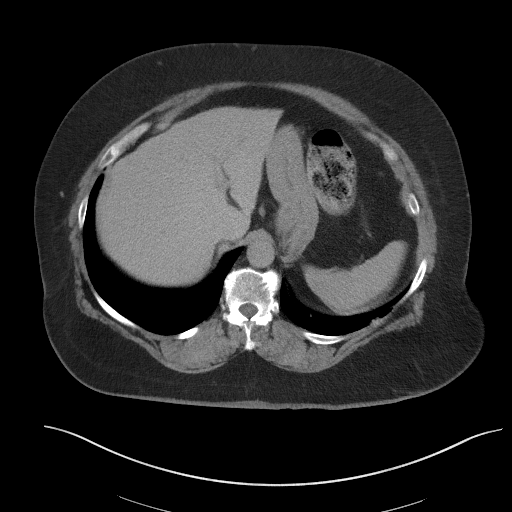
[im 83/94  soft-tissue]
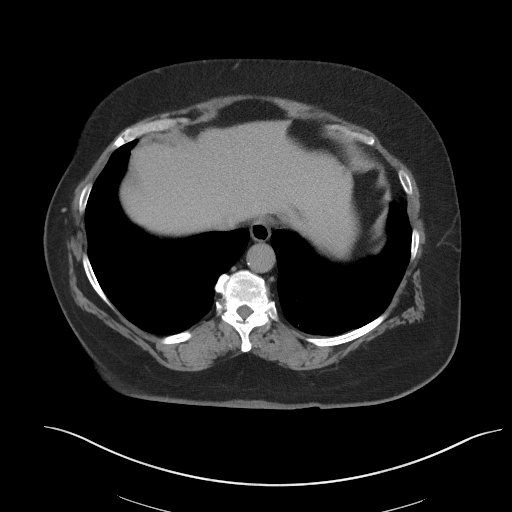
[im 88/94  soft-tissue]
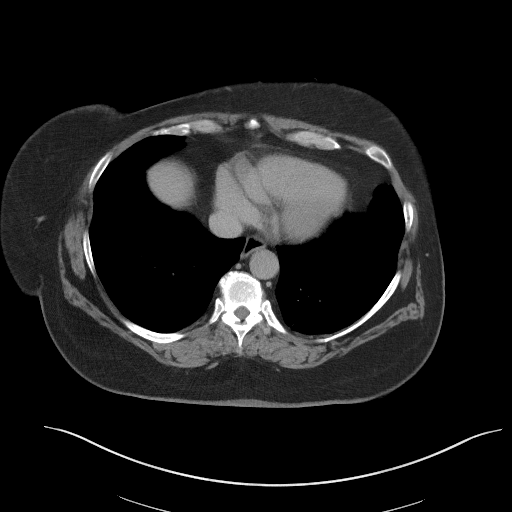

[Series 6: coronal st · coronal · 0.88mm/px · 3 of 117 slices shown]
[im 39/117  soft-tissue]
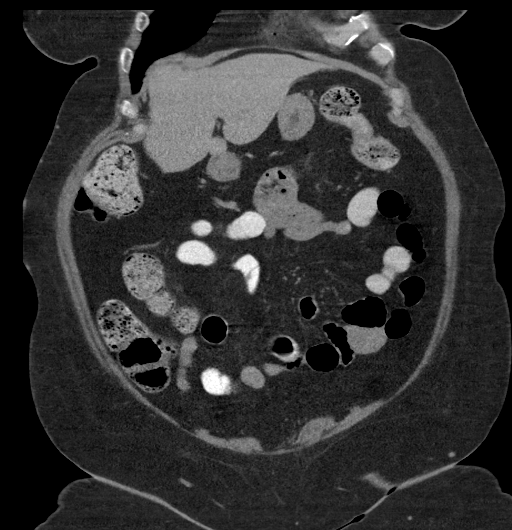
[im 52/117  soft-tissue]
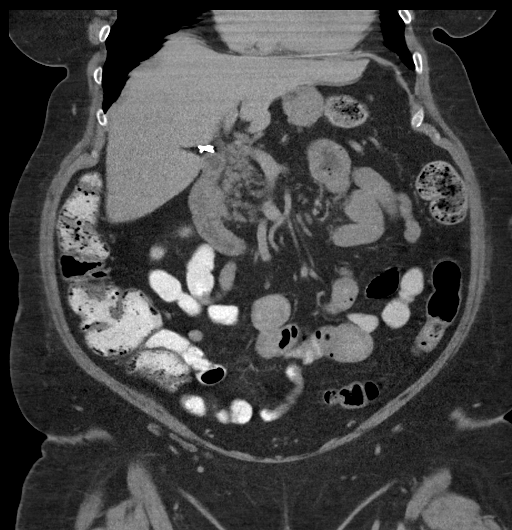
[im 65/117  soft-tissue]
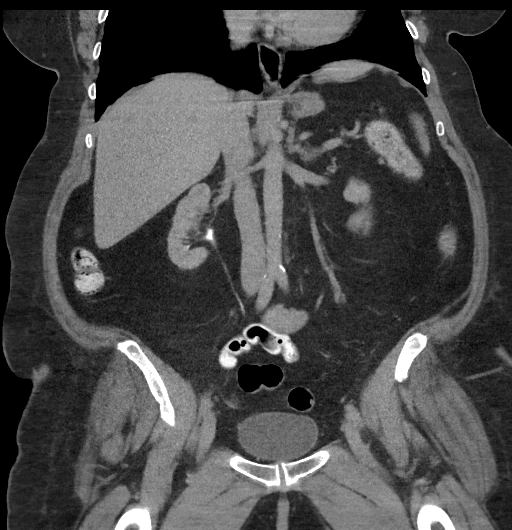

[17 of 46 positions shown; findings below may reference images not displayed]

FINDINGS: Lower chest: No acute abnormality.

Hepatobiliary: No focal liver abnormality is seen. Status post
cholecystectomy.

Pancreas: As on prior examinations, the pancreatic tail is extremely
atrophic or absent. The existing parenchyma of the pancreatic head
and uncinate are normal. No pancreatic ductal dilatation or
surrounding inflammatory changes.

Spleen: Normal in size without focal abnormality.

Adrenals/Urinary Tract: Adrenal glands are unremarkable. Kidneys are
normal, without renal calculi, focal lesion, or hydronephrosis.
Bladder is unremarkable.

Stomach/Bowel: Stomach is within normal limits. Appendix appears
normal. No evidence of bowel wall thickening, distention, or
inflammatory changes.

Vascular/Lymphatic: No significant vascular findings are present. No
enlarged abdominal or pelvic lymph nodes.

Reproductive: No mass or other abnormality. Status post
hysterectomy.

Other: No abdominal wall hernia or abnormality. No abdominopelvic
ascites.

Musculoskeletal: No acute or significant osseous findings.
IMPRESSION: 1. No acute CT abnormality of the abdomen or pelvis to explain left
flank pain.

2. As on prior examinations, the pancreatic tail is extremely
atrophic or absent. The existing parenchyma of the pancreatic head
and uncinate are normal.

3.  Status post cholecystectomy.

## 2021-02-18 ENCOUNTER — Other Ambulatory Visit: Payer: Self-pay

## 2021-02-18 ENCOUNTER — Emergency Department (HOSPITAL_COMMUNITY)
Admission: EM | Admit: 2021-02-18 | Discharge: 2021-02-18 | Disposition: A | Discharge status: Home / Self Care | Payer: 59 | Attending: Emergency Medicine | Admitting: Emergency Medicine

## 2021-02-18 ENCOUNTER — Encounter (HOSPITAL_COMMUNITY): Payer: Self-pay | Admitting: *Deleted

## 2021-02-18 DIAGNOSIS — E039 Hypothyroidism, unspecified: Secondary | ICD-10-CM | POA: Insufficient documentation

## 2021-02-18 DIAGNOSIS — Z79899 Other long term (current) drug therapy: Secondary | ICD-10-CM | POA: Insufficient documentation

## 2021-02-18 DIAGNOSIS — E119 Type 2 diabetes mellitus without complications: Secondary | ICD-10-CM | POA: Insufficient documentation

## 2021-02-18 DIAGNOSIS — I10 Essential (primary) hypertension: Secondary | ICD-10-CM | POA: Diagnosis not present

## 2021-02-18 DIAGNOSIS — R21 Rash and other nonspecific skin eruption: Secondary | ICD-10-CM | POA: Diagnosis present

## 2021-02-18 DIAGNOSIS — J45909 Unspecified asthma, uncomplicated: Secondary | ICD-10-CM | POA: Diagnosis not present

## 2021-02-18 DIAGNOSIS — Z87891 Personal history of nicotine dependence: Secondary | ICD-10-CM | POA: Diagnosis not present

## 2021-02-18 DIAGNOSIS — B029 Zoster without complications: Secondary | ICD-10-CM | POA: Insufficient documentation

## 2021-02-18 MED ORDER — VALACYCLOVIR HCL 1 G PO TABS: 1000.0000 mg | ORAL_TABLET | Freq: Three times a day (TID) | ORAL | 0 refills | Status: AC

## 2021-02-18 MED ORDER — HYDROMORPHONE HCL 2 MG/ML IJ SOLN
2.0000 mg | Freq: Once | INTRAMUSCULAR | Status: AC
  Administered 2021-02-18: 2 mg via INTRAMUSCULAR
  Filled 2021-02-18: qty 1

## 2021-02-18 MED ORDER — VALACYCLOVIR HCL 500 MG PO TABS
1000.0000 mg | ORAL_TABLET | Freq: Once | ORAL | Status: AC
  Administered 2021-02-18: 1000 mg via ORAL
  Filled 2021-02-18: qty 2

## 2021-02-18 MED ORDER — LORAZEPAM 2 MG/ML IJ SOLN
2.0000 mg | Freq: Once | INTRAMUSCULAR | Status: AC
  Administered 2021-02-18: 2 mg via INTRAMUSCULAR
  Filled 2021-02-18: qty 1

## 2021-02-18 MED ORDER — LORAZEPAM 1 MG PO TABS: 1.0000 mg | ORAL_TABLET | Freq: Three times a day (TID) | ORAL | 0 refills | Status: DC | PRN

## 2021-02-18 MED ORDER — PREDNISONE 20 MG PO TABS: 20.0000 mg | ORAL_TABLET | Freq: Two times a day (BID) | ORAL | 0 refills | Status: DC

## 2021-02-18 MED ORDER — HYDROMORPHONE HCL 2 MG/ML IJ SOLN: 2.0000 mg | Freq: Once | INTRAMUSCULAR | Status: DC

## 2021-02-18 MED ORDER — OXYCODONE-ACETAMINOPHEN 5-325 MG PO TABS
1.0000 | ORAL_TABLET | Freq: Once | ORAL | Status: AC
  Administered 2021-02-18: 1 via ORAL
  Filled 2021-02-18: qty 1

## 2021-02-18 MED ORDER — ONDANSETRON 8 MG PO TBDP
8.0000 mg | ORAL_TABLET | Freq: Once | ORAL | Status: AC
  Administered 2021-02-18: 8 mg via ORAL
  Filled 2021-02-18: qty 1

## 2021-02-18 MED ORDER — OXYCODONE-ACETAMINOPHEN 5-325 MG PO TABS: 1.0000 | ORAL_TABLET | Freq: Four times a day (QID) | ORAL | 0 refills | Status: DC | PRN

## 2021-02-18 NOTE — ED Notes (Signed)
Provider notified pain is "not any better per pt."

## 2021-02-18 NOTE — ED Notes (Signed)
Per pt pain is only easing up alittle, not much at all.

## 2021-02-18 NOTE — Discharge Instructions (Addendum)
We sent prescriptions to your pharmacy to help you be more comfortable and speed healing of the rash.  Keep the rash clean with soap and water daily.  See your doctor for further care and treatment.

## 2021-02-18 NOTE — ED Provider Notes (Signed)
[facility] Provider Note   CSN: [id number] Arrival date & time: 02/18/21  2353     History Chief Complaint  Patient presents with   Rash    Michelle Rich is a 65 y.o. female.  HPI She presents for evaluation of rash which started 5 days ago, followed by upper abdominal pain which started yesterday.  She has had a shingles vaccine.  She denies vomiting, fever, chills, cough, shortness of breath, weakness or dizziness.  There are no other known active modifying factors.    Past Medical History:  Diagnosis Date   Adenoma of large intestine    "flat"   Asthma    Chronic fatigue syndrome    Constipation    Diarrhea    Diverticulosis    Fibromyalgia    GERD (gastroesophageal reflux disease)    s/p Nissen   Hypercholesteremia    Hypertension    Hypothyroidism    IBS (irritable bowel syndrome)    Migraines    S/P colonoscopy    2007: internal/external hemorrhoids, otherwise normal by Dr. Debby Bud. June 2012:normal but biopsy with focal acute colitis, non-specific by Dr. Rowe Pavy    S/P endoscopy 2009, 2012   normal, small bowel biopsy negative, giardia negative, celiac negative, 2012: reflux esophagitis, benign bx, negative celiac   Shortness of breath     Patient Active Problem List   Diagnosis Date Noted   Esophageal dysphagia 08/17/2018   Pancreatic abnormality 08/17/2018   LUQ pain 08/17/2018   Melena 08/17/2018   Nausea 08/12/2011   Abdominal pain 04/25/2011   HYPOTHYROIDISM 05/01/2008   HYPERCHOLESTEROLEMIA 05/01/2008   HYPERTENSION 05/01/2008   GERD 05/01/2008   GASTRITIS 05/01/2008   IBS 05/01/2008   CHRONIC FATIGUE SYNDROME 05/01/2008   ANOREXIA 05/01/2008   WEIGHT LOSS 05/01/2008   HEARTBURN 05/01/2008   DIARRHEA 05/01/2008   DIABETES MELLITUS, BORDERLINE 05/01/2008    Past Surgical History:  Procedure Laterality Date   ABDOMINAL HYSTERECTOMY     CARPAL TUNNEL RELEASE     bilateral x2   CHOLECYSTECTOMY     COLONOSCOPY   09/02/2011   Dr. Charmayne Sheer prep, left-sided diverticulosis, normal terminal ileum, internal hemorrhoids.  Random colon biopsies needed for microscopic colitis but several showed adenomatous changes.     COLONOSCOPY  12/09/2011   Dr. Ok Edwards diverticulosis, bx= benign colonic mucosa.  Next colonoscopy 5 years   ELBOW SURGERY     right   ESOPHAGOGASTRODUODENOSCOPY  05/11/2011   Dr. Volney American esophageal erosions, intact fundoplication, erythematous small bowel.  Small bowel biopsies negative for celiac   HERNIA REPAIR     Nissen     RHINOPLASTY     S/P H ysterectomy     partial 1985, complete 1986 secondary to tumor and cysts in ovaries.   steel rods and buttons for abdominal wound     TONSILLECTOMY     TRIGGER FINGER RELEASE     bilateral   TUBAL LIGATION     Umbilical Herniorrhapy       OB History   No obstetric history on file.     Family History  Problem Relation Age of Onset   Colon cancer Maternal Grandfather        deceased age 72   Ulcerative colitis Father        ? question of UC   Anesthesia problems Neg Hx    Hypotension Neg Hx    Malignant hyperthermia Neg Hx    Pseudochol deficiency Neg Hx     Social  History   Tobacco Use   Smoking status: Former    Packs/day: 1.00    Years: 40.00    Pack years: 40.00    Types: Cigarettes    Quit date: 09/01/2006    Years since quitting: 14.4   Smokeless tobacco: Never   Tobacco comments:    quit 3 years ago  Substance Use Topics   Alcohol use: No   Drug use: No    Home Medications Prior to Admission medications   Medication Sig Start Date End Date Taking? Authorizing Provider  LORazepam (ATIVAN) 1 MG tablet Take 1 tablet (1 mg total) by mouth 3 (three) times daily as needed for anxiety (And muscle spasm). 02/18/21  Yes Daleen Bo, MD  oxyCODONE-acetaminophen (PERCOCET/ROXICET) 5-325 MG tablet Take 1 tablet by mouth every 6 (six) hours as needed for severe pain. 02/18/21  Yes Daleen Bo, MD   predniSONE (DELTASONE) 20 MG tablet Take 1 tablet (20 mg total) by mouth 2 (two) times daily. 02/18/21  Yes Daleen Bo, MD  valACYclovir (VALTREX) 1000 MG tablet Take 1 tablet (1,000 mg total) by mouth 3 (three) times daily for 14 days. 02/18/21 03/04/21 Yes Daleen Bo, MD  albuterol (PROVENTIL HFA;VENTOLIN HFA) 108 (90 BASE) MCG/ACT inhaler Inhale 2 puffs into the lungs every 6 (six) hours as needed. Asthma Symptoms     [provider]  ALPRAZolam (XANAX) 0.5 MG tablet Take 0.5 mg by mouth 3 (three) times daily as needed. Sleep/Anxiety    [provider]  atenolol (TENORMIN) 25 MG tablet Take 25 mg by mouth daily.    [provider]  Cholecalciferol 50 MCG (2000 UT) TABS Take 1 tablet by mouth daily.     [provider]  dexlansoprazole (DEXILANT) 60 MG capsule Take 1 capsule (60 mg total) by mouth daily before breakfast. 08/17/18   Mahala Menghini, PA-C  dextroamphetamine (DEXEDRINE SPANSULE) 15 MG 24 hr capsule Take 15 mg by mouth daily.      [provider]  FLUoxetine (PROZAC) 20 MG capsule Take 60 mg by mouth daily.     [provider]  fluticasone (FLONASE) 50 MCG/ACT nasal spray Place 2 sprays into both nostrils daily.    [provider]  Fluticasone-Salmeterol (ADVAIR DISKUS) 250-50 MCG/DOSE AEPB Inhale 1 puff into the lungs daily.     [provider]  levothyroxine (SYNTHROID, LEVOTHROID) 88 MCG tablet Take 150 mcg by mouth daily.     [provider]  omega-3 acid ethyl esters (LOVAZA) 1 G capsule Take 2 g by mouth 2 (two) times daily.      [provider]  OnabotulinumtoxinA (BOTOX IJ) Inject 1 application as directed every 3 (three) months. Receives injection for migraines, unknown dose    [provider]  Pancrelipase, Lip-Prot-Amyl, (CREON) 36000 UNITS CPEP Take 2 capsules by mouth 3 (three) times daily with meals. Take 1 with snacks. Patient taking differently: Take 2 capsules by  mouth 2 (two) times daily. Take 1 with snacks. 10/04/13   Annitta Needs, NP  primidone (MYSOLINE) 50 MG tablet Take 1 tablet by mouth Three times a day. 09/27/11   [provider]  promethazine (PHENERGAN) 25 MG tablet Take 1 tablet (25 mg total) by mouth every 6 (six) hours as needed. for nausea/vomiting Patient taking differently: Take 50 mg by mouth every 6 (six) hours as needed. for nausea/vomiting 02/08/12   Mahala Menghini, PA-C  simvastatin (ZOCOR) 80 MG tablet Take 40 mg by mouth at bedtime.  [provider]  telmisartan-hydrochlorothiazide (MICARDIS HCT) 80-12.5 MG per tablet Take 1 tablet by mouth daily.      [provider]  tiZANidine (ZANAFLEX) 4 MG tablet Take 4 mg by mouth 3 (three) times daily.    [provider]    Allergies    Other, Amitriptyline, Aspirin, Codeine, Morphine and related, Nsaids, Penicillins, Propoxyphene n-acetaminophen, and Sulfa antibiotics  Review of Systems   Review of Systems  All other systems reviewed and are negative.  Physical Exam Updated Vital Signs BP 115/64   Pulse 80   Temp 99.1 F (37.3 C) (Oral)   Resp 18   Ht 5\' 5"  (1.651 m)   Wt 108.9 kg   SpO2 98%   BMI 39.94 kg/m   Physical Exam Vitals and nursing note reviewed.  Constitutional:      General: She is in acute distress (Uncomfortable).     Appearance: She is well-developed. She is obese. She is not ill-appearing, toxic-appearing or diaphoretic.  HENT:     Head: Normocephalic and atraumatic.     Right Ear: External ear normal.     Left Ear: External ear normal.  Eyes:     Conjunctiva/sclera: Conjunctivae normal.     Pupils: Pupils are equal, round, and reactive to light.  Neck:     Trachea: Phonation normal.  Cardiovascular:     Rate and Rhythm: Normal rate.  Pulmonary:     Effort: Pulmonary effort is normal.  Abdominal:     General: There is no distension.  Musculoskeletal:        General: Normal range of motion.     Cervical  back: Normal range of motion and neck supple.  Skin:    General: Skin is warm and dry.     Comments: Vesicular rash, right T8 dermatome.  Consistent with shingles.  Neurological:     Mental Status: She is alert and oriented to person, place, and time.     Cranial Nerves: No cranial nerve deficit.     Sensory: No sensory deficit.     Motor: No abnormal muscle tone.     Coordination: Coordination normal.  Psychiatric:        Mood and Affect: Mood normal.        Behavior: Behavior normal.        Thought Content: Thought content normal.        Judgment: Judgment normal.    ED Results / Procedures / Treatments   Labs (all labs ordered are listed, but only abnormal results are displayed) Labs Reviewed - No data to display  EKG None  Radiology No results found.  Procedures Procedures   Medications Ordered in ED Medications  valACYclovir (VALTREX) tablet 1,000 mg (1,000 mg Oral Given 02/18/21 0922)  oxyCODONE-acetaminophen (PERCOCET/ROXICET) 5-325 MG per tablet 1 tablet (1 tablet Oral Given 02/18/21 0922)  ondansetron (ZOFRAN-ODT) disintegrating tablet 8 mg (8 mg Oral Given 02/18/21 1235)  LORazepam (ATIVAN) injection 2 mg (2 mg Intramuscular Given 02/18/21 1235)  HYDROmorphone (DILAUDID) injection 2 mg (2 mg Intramuscular Given 02/18/21 1236)    ED Course  I have reviewed the triage vital signs and the nursing notes.  Pertinent labs & imaging results that were available during my care of the patient were reviewed by me and considered in my medical decision making (see chart for details).    MDM Rules/Calculators/A&P  No data found.  At time of discharge-reevaluation with update and discussion. After initial assessment and treatment, an updated evaluation reveals she is more comfortable.  Findings discussed with patient and husband, all questions answered. Daleen Bo   Medical Decision Making:  This patient is presenting for evaluation of rash  for several days, which does require a range of treatment options, and is a complaint that involves a moderate risk of morbidity and mortality. The differential diagnoses include dermatitis, shingles cardiology. I decided to review old records, and in summary elderly female presenting for painful rash.  She has been previously treated with shingles vaccine.  No recent changes in medication.  She has history of hypothyroidism hypertension, chronic fatigue and hyperglycemia.  I obtained additional historical information from husband at bedside.    Critical Interventions-clinical evaluation, medication treatment, observation and reassessment  After These Interventions, the Patient was reevaluated and was found stable for discharge.  Patient presenting with rash pathognomonic for shingles.  She is several days into this however due to age and infirmity, will be given prescription for Valtrex, after starting in the ED.  Pain improved after treatment.  Stable for discharge.  CRITICAL CARE-no Performed by: Daleen Bo  Nursing Notes Reviewed/ Care Coordinated Applicable Imaging Reviewed Interpretation of Laboratory Data incorporated into ED treatment  The patient appears reasonably screened and/or stabilized for discharge and I doubt any other medical condition or other [facility] requiring further screening, evaluation, or treatment in the ED at this time prior to discharge.  Plan: Home Medications-continue usual; Home Treatments-gradual advance diet and activity; return here if the recommended treatment, does not improve the symptoms; Recommended follow up-PCP, as needed     Final Clinical Impression(s) / ED Diagnoses Final diagnoses:  None    Rx / DC Orders ED Discharge Orders          Ordered    oxyCODONE-acetaminophen (PERCOCET/ROXICET) 5-325 MG tablet  Every 6 hours PRN        02/18/21 1412    predniSONE (DELTASONE) 20 MG tablet  2 times daily        02/18/21 1412    LORazepam  (ATIVAN) 1 MG tablet  3 times daily PRN        02/18/21 1412    valACYclovir (VALTREX) 1000 MG tablet  3 times daily        02/18/21 1412             Daleen Bo, MD 02/19/21 1555

## 2021-02-18 NOTE — ED Triage Notes (Signed)
Pt c/o pain to right side of back and abdomen that started last Tuesday. Last Friday a rash broke out from the middle of her back wrapping around under her right breast. Blisters present. Pt c/o extreme pain. Denies n/v/d.

## 2023-03-29 NOTE — Progress Notes (Unsigned)
Referring Provider:*** Primary Care Physician:  Renaldo Harrison, DO Primary Gastroenterologist:  Dr. Bonnetta Barry chief complaint on file.   HPI:   Michelle Rich is a 67 y.o. female with history of HTN, HLD, hypothyroidism, fibromyalgia, CKD, adenomatous colon polyps, overdue for colonoscopy,, IBS mixed type, GERD, chronic abnormal CT of the pancreas noting extremely atrophic or absent pancreatic tail, chronic abdominal pain, presenting today with chief complaint of ***  We have not seen patient since 2020.  She is scheduled for a CT at that time to further evaluate LUQ abdominal pain.  There were no acute findings and she was ultimately advised to have an EGD with possible dilation due to GERD, dysphagia, abdominal pain along with colonoscopy for surveillance purposes.  Procedures were scheduled, but patient later called to cancel.  She was recently evaluated at [facility] 03/28/2023.  No physician note is available, but nursing notes report right-sided abdominal pain, swelling, diarrhea x 2 months, numbness across back.  She had no acute abnormalities on CBC or CMP.  Lipase was within normal limits.  UA negative.  Chest x-ray negative.  CT A/P without contrast again with poor visualization of pancreatic tail but pancreatic head and uncinate process within the limits.  Evidence of prior cholecystectomy, stable adrenal adenomas.  No acute findings.  She was advised to follow-up with PCP and gastroenterology   Today:    Overdue for colonoscopy   Past Medical History:  Diagnosis Date   Adenoma of large intestine    "flat"   Asthma    Chronic fatigue syndrome    Constipation    Diarrhea    Diverticulosis    Fibromyalgia    GERD (gastroesophageal reflux disease)    s/p Nissen   Hypercholesteremia    Hypertension    Hypothyroidism    IBS (irritable bowel syndrome)    Migraines    S/P colonoscopy    2007: internal/external hemorrhoids, otherwise normal by Dr. Murlean Hark. June  2012:normal but biopsy with focal acute colitis, non-specific by Dr. Linna Darner    S/P endoscopy 2009, 2012   normal, small bowel biopsy negative, giardia negative, celiac negative, 2012: reflux esophagitis, benign bx, negative celiac   Shortness of breath     Past Surgical History:  Procedure Laterality Date   ABDOMINAL HYSTERECTOMY     CARPAL TUNNEL RELEASE     bilateral x2   CHOLECYSTECTOMY     COLONOSCOPY  09/02/2011   Dr. Terri Skains prep, left-sided diverticulosis, normal terminal ileum, internal hemorrhoids.  Random colon biopsies needed for microscopic colitis but several showed adenomatous changes.     COLONOSCOPY  12/09/2011   Dr. Merlyn Lot diverticulosis, bx= benign colonic mucosa.  Next colonoscopy 5 years   ELBOW SURGERY     right   ESOPHAGOGASTRODUODENOSCOPY  05/11/2011   Dr. Marny Lowenstein esophageal erosions, intact fundoplication, erythematous small bowel.  Small bowel biopsies negative for celiac   HERNIA REPAIR     Nissen     RHINOPLASTY     S/P H ysterectomy     partial 1985, complete 1986 secondary to tumor and cysts in ovaries.   steel rods and buttons for abdominal wound     TONSILLECTOMY     TRIGGER FINGER RELEASE     bilateral   TUBAL LIGATION     Umbilical Herniorrhapy      Current Outpatient Medications  Medication Sig Dispense Refill   albuterol (PROVENTIL HFA;VENTOLIN HFA) 108 (90 BASE) MCG/ACT inhaler Inhale 2 puffs into the lungs every 6 (  six) hours as needed. Asthma Symptoms      ALPRAZolam (XANAX) 0.5 MG tablet Take 0.5 mg by mouth 3 (three) times daily as needed. Sleep/Anxiety     atenolol (TENORMIN) 25 MG tablet Take 25 mg by mouth daily.     Cholecalciferol 50 MCG (2000 UT) TABS Take 1 tablet by mouth daily.      dexlansoprazole (DEXILANT) 60 MG capsule Take 1 capsule (60 mg total) by mouth daily before breakfast. 90 capsule 3   dextroamphetamine (DEXEDRINE SPANSULE) 15 MG 24 hr capsule Take 15 mg by mouth daily.       FLUoxetine (PROZAC) 20  MG capsule Take 60 mg by mouth daily.      fluticasone (FLONASE) 50 MCG/ACT nasal spray Place 2 sprays into both nostrils daily.     Fluticasone-Salmeterol (ADVAIR DISKUS) 250-50 MCG/DOSE AEPB Inhale 1 puff into the lungs daily.      levothyroxine (SYNTHROID, LEVOTHROID) 88 MCG tablet Take 150 mcg by mouth daily.      LORazepam (ATIVAN) 1 MG tablet Take 1 tablet (1 mg total) by mouth 3 (three) times daily as needed for anxiety (And muscle spasm). 20 tablet 0   omega-3 acid ethyl esters (LOVAZA) 1 G capsule Take 2 g by mouth 2 (two) times daily.       OnabotulinumtoxinA (BOTOX IJ) Inject 1 application as directed every 3 (three) months. Receives injection for migraines, unknown dose     oxyCODONE-acetaminophen (PERCOCET/ROXICET) 5-325 MG tablet Take 1 tablet by mouth every 6 (six) hours as needed for severe pain. 20 tablet 0   Pancrelipase, Lip-Prot-Amyl, (CREON) 36000 UNITS CPEP Take 2 capsules by mouth 3 (three) times daily with meals. Take 1 with snacks. (Patient taking differently: Take 2 capsules by mouth 2 (two) times daily. Take 1 with snacks.) 240 capsule 5   predniSONE (DELTASONE) 20 MG tablet Take 1 tablet (20 mg total) by mouth 2 (two) times daily. 10 tablet 0   primidone (MYSOLINE) 50 MG tablet Take 1 tablet by mouth Three times a day.     promethazine (PHENERGAN) 25 MG tablet Take 1 tablet (25 mg total) by mouth every 6 (six) hours as needed. for nausea/vomiting (Patient taking differently: Take 50 mg by mouth every 6 (six) hours as needed. for nausea/vomiting) 20 tablet 0   simvastatin (ZOCOR) 80 MG tablet Take 40 mg by mouth at bedtime.      telmisartan-hydrochlorothiazide (MICARDIS HCT) 80-12.5 MG per tablet Take 1 tablet by mouth daily.       tiZANidine (ZANAFLEX) 4 MG tablet Take 4 mg by mouth 3 (three) times daily.     No current facility-administered medications for this visit.    Allergies as of 03/30/2023 - Review Complete 02/18/2021  Allergen Reaction Noted   Other   04/27/2011   Amitriptyline Rash 02/24/2011   Aspirin Rash 02/24/2011   Codeine Rash    Morphine and codeine Rash 02/24/2011   Nsaids Rash 04/27/2011   Penicillins Rash    Propoxyphene n-acetaminophen Rash    Sulfa antibiotics Rash 02/24/2011    Family History  Problem Relation Age of Onset   Colon cancer Maternal Grandfather        deceased age 25   Ulcerative colitis Father        ? question of UC   Anesthesia problems Neg Hx    Hypotension Neg Hx    Malignant hyperthermia Neg Hx    Pseudochol deficiency Neg Hx     Social History   Socioeconomic  History   Marital status: Legally Separated    Spouse name: Not on file   Number of children: Not on file   Years of education: Not on file   Highest education level: Not on file  Occupational History   Not on file  Tobacco Use   Smoking status: Former    Current packs/day: 0.00    Average packs/day: 1 pack/day for 40.0 years (40.0 ttl pk-yrs)    Types: Cigarettes    Start date: 09/01/1966    Quit date: 09/01/2006    Years since quitting: 16.5   Smokeless tobacco: Never   Tobacco comments:    quit 3 years ago  Substance and Sexual Activity   Alcohol use: No   Drug use: No   Sexual activity: Yes    Birth control/protection: Surgical  Other Topics Concern   Not on file  Social History Narrative   Not on file   Social Determinants of Health   Financial Resource Strain: Not on file  Food Insecurity: Not on file  Transportation Needs: Not on file  Physical Activity: Not on file  Stress: Not on file  Social Connections: Not on file  Intimate Partner Violence: Not on file    Review of Systems: Gen: Denies any fever, chills, fatigue, weight loss, lack of appetite.  CV: Denies chest pain, heart palpitations, peripheral edema, syncope.  Resp: Denies shortness of breath at rest or with exertion. Denies wheezing or cough.  GI: Denies dysphagia or odynophagia. Denies jaundice, hematemesis, fecal incontinence. GU :  Denies urinary burning, urinary frequency, urinary hesitancy MS: Denies joint pain, muscle weakness, cramps, or limitation of movement.  Derm: Denies rash, itching, dry skin Psych: Denies depression, anxiety, memory loss, and confusion Heme: Denies bruising, bleeding, and enlarged lymph nodes.  Physical Exam: There were no vitals taken for this visit. General:   Alert and oriented. Pleasant and cooperative. Well-nourished and well-developed.  Head:  Normocephalic and atraumatic. Eyes:  Without icterus, sclera clear and conjunctiva pink.  Ears:  Normal auditory acuity. Lungs:  Clear to auscultation bilaterally. No wheezes, rales, or rhonchi. No distress.  Heart:  S1, S2 present without murmurs appreciated.  Abdomen:  +BS, soft, non-tender and non-distended. No HSM noted. No guarding or rebound. No masses appreciated.  Rectal:  Deferred  Msk:  Symmetrical without gross deformities. Normal posture. Extremities:  Without edema. Neurologic:  Alert and  oriented x4;  grossly normal neurologically. Skin:  Intact without significant lesions or rashes. Psych:  Alert and cooperative. Normal mood and affect.    Assessment:     Plan:  ***   Ermalinda Memos, PA-C [facility] 03/30/2023

## 2023-03-29 NOTE — H&P (View-Only) (Signed)
Primary Care Physician:  Renaldo Harrison, DO Primary Gastroenterologist:  Dr. Jena Gauss  Chief Complaint  Patient presents with   Abdominal Pain    Stomach spam and knots. Sharpe pains on the right side of abdomin. Stomach is burning. Black watery stools.      HPI:   Michelle Rich is a 67 y.o. female with history of HTN, HLD, hypothyroidism, fibromyalgia, CKD, adenomatous colon polyps, overdue for colonoscopy, IBS mixed type, GERD, chronic abnormal CT of the pancreas noting extremely atrophic or absent pancreatic tail, chronic abdominal pain, presenting today with chief complaint of Upper abdominal pain, diarrhea, black stools.   We have not seen patient since 2020.  She is scheduled for a CT at that time to further evaluate LUQ abdominal pain.  There were no acute findings and she was ultimately advised to have an EGD with possible dilation due to GERD, dysphagia, abdominal pain along with colonoscopy for surveillance purposes.  Procedures were scheduled, but patient later called to cancel.  She was recently evaluated at [facility] 03/28/2023.  No physician note is available, but nursing notes report right-sided abdominal pain, swelling, diarrhea x 2 months, numbness across back.  She had no acute abnormalities on CBC or CMP.  Lipase was within normal limits.  UA negative.  Chest x-ray negative.  CT A/P without contrast again with poor visualization of pancreatic tail but pancreatic head and uncinate process within the limits.  Evidence of prior cholecystectomy, stable adrenal adenomas.  No acute findings.  She was advised to follow-up with PCP and gastroenterology.    Today: Constant stomach burning. Having diarrhea or loose stool any time she eats. Stool is black and green. No iron or pepto bismol. Having stabbing pain, swelling, and knotting sensation in the RUQ. Also having some discomfort in the substernal region. Worsened by eating. Seems like everything is bothering her. Eating mostly  toast right now. Has nausea but no vomiting. No dysphagia.   Can have 5-6 Bms per day. Also with nocturnal stools. Doesn't always feel like she is having a complete stool because she constantly has the sensation she needs to go. No brbpr.  Takes 2 creon with meals. Doesn't eat snacks.  Prior to this, stools were brown. Was alternating between constipation and diarrhea. Would have 1 a day or skip 3-4 days between a BM.  No recent antibiotics.  No new medications prior to onset.  Drinks filtered well water.   Just started pantoprazole 40 mg daily per ER. Had been taking Dexilant. No heartburn. Also has phenergan to use prn.   All symptoms started about 3 months ago.   No NSAIDs or ETOH.   Overdue for colonoscopy   Past Medical History:  Diagnosis Date   Adenoma of large intestine    "flat"   Asthma    Chronic fatigue syndrome    Constipation    Diarrhea    Diverticulosis    Fibromyalgia    GERD (gastroesophageal reflux disease)    s/p Nissen   Hypercholesteremia    Hypertension    Hypothyroidism    IBS (irritable bowel syndrome)    Migraines    S/P colonoscopy    2007: internal/external hemorrhoids, otherwise normal by Dr. Murlean Hark. June 2012:normal but biopsy with focal acute colitis, non-specific by Dr. Linna Darner    S/P endoscopy 2009, 2012   normal, small bowel biopsy negative, giardia negative, celiac negative, 2012: reflux esophagitis, benign bx, negative celiac   Shortness of breath  Past Surgical History:  Procedure Laterality Date   ABDOMINAL HYSTERECTOMY     CARPAL TUNNEL RELEASE     bilateral x2   CHOLECYSTECTOMY     COLONOSCOPY  09/02/2011   Dr. Terri Skains prep, left-sided diverticulosis, normal terminal ileum, internal hemorrhoids.  Random colon biopsies needed for microscopic colitis but several showed adenomatous changes.     COLONOSCOPY  12/09/2011   Dr. Merlyn Lot diverticulosis, bx= benign colonic mucosa.  Next colonoscopy 5 years   ELBOW SURGERY      right   ESOPHAGOGASTRODUODENOSCOPY  05/11/2011   Dr. Marny Lowenstein esophageal erosions, intact fundoplication, erythematous small bowel.  Small bowel biopsies negative for celiac   HERNIA REPAIR     Nissen     RHINOPLASTY     S/P H ysterectomy     partial 1985, complete 1986 secondary to tumor and cysts in ovaries.   steel rods and buttons for abdominal wound     TONSILLECTOMY     TRIGGER FINGER RELEASE     bilateral   TUBAL LIGATION     Umbilical Herniorrhapy      Current Outpatient Medications  Medication Sig Dispense Refill   albuterol (PROVENTIL HFA;VENTOLIN HFA) 108 (90 BASE) MCG/ACT inhaler Inhale 2 puffs into the lungs every 6 (six) hours as needed. Asthma Symptoms      atenolol (TENORMIN) 25 MG tablet Take 25 mg by mouth daily.     Cholecalciferol 50 MCG (2000 UT) TABS Take 1 tablet by mouth daily.     dextroamphetamine (DEXEDRINE SPANSULE) 15 MG 24 hr capsule Take 15 mg by mouth daily.       FLUoxetine (PROZAC) 20 MG capsule Take 60 mg by mouth daily.      fluticasone (FLONASE) 50 MCG/ACT nasal spray Place 2 sprays into both nostrils daily.     Fluticasone-Salmeterol (ADVAIR DISKUS) 250-50 MCG/DOSE AEPB Inhale 1 puff into the lungs daily.      levothyroxine (SYNTHROID, LEVOTHROID) 88 MCG tablet Take 150 mcg by mouth daily.      omega-3 acid ethyl esters (LOVAZA) 1 G capsule Take 2 g by mouth 2 (two) times daily.       oxyCODONE-acetaminophen (PERCOCET/ROXICET) 5-325 MG tablet Take 1 tablet by mouth every 6 (six) hours as needed for severe pain. 20 tablet 0   Pancrelipase, Lip-Prot-Amyl, (CREON) 36000 UNITS CPEP Take 2 capsules by mouth 3 (three) times daily with meals. Take 1 with snacks. (Patient taking differently: Take 2 capsules by mouth 2 (two) times daily. Take 1 with snacks.) 240 capsule 5   pantoprazole (PROTONIX) 40 MG tablet Take 1 tablet (40 mg total) by mouth 2 (two) times daily. 60 tablet 3   promethazine (PHENERGAN) 25 MG tablet Take 1 tablet (25 mg total) by  mouth every 6 (six) hours as needed. for nausea/vomiting (Patient taking differently: Take 50 mg by mouth every 6 (six) hours as needed. for nausea/vomiting) 20 tablet 0   telmisartan-hydrochlorothiazide (MICARDIS HCT) 80-12.5 MG per tablet Take 1 tablet by mouth daily.       tiZANidine (ZANAFLEX) 4 MG tablet Take 4 mg by mouth 3 (three) times daily.     OnabotulinumtoxinA (BOTOX IJ) Inject 1 application as directed every 3 (three) months. Receives injection for migraines, unknown dose (Patient not taking: Reported on 03/30/2023)     No current facility-administered medications for this visit.    Allergies as of 03/30/2023 - Review Complete 03/30/2023  Allergen Reaction Noted   Other  04/27/2011   Amitriptyline Rash 02/24/2011  Aspirin Rash 02/24/2011   Codeine Rash    Morphine and codeine Rash 02/24/2011   Nsaids Rash 04/27/2011   Penicillins Rash    Propoxyphene n-acetaminophen Rash    Sulfa antibiotics Rash 02/24/2011    Family History  Problem Relation Age of Onset   Colon cancer Maternal Grandfather        deceased age 13   Ulcerative colitis Father        ? question of UC   Anesthesia problems Neg Hx    Hypotension Neg Hx    Malignant hyperthermia Neg Hx    Pseudochol deficiency Neg Hx     Social History   Socioeconomic History   Marital status: Legally Separated    Spouse name: Not on file   Number of children: Not on file   Years of education: Not on file   Highest education level: Not on file  Occupational History   Not on file  Tobacco Use   Smoking status: Former    Current packs/day: 0.00    Average packs/day: 1 pack/day for 40.0 years (40.0 ttl pk-yrs)    Types: Cigarettes    Start date: 09/01/1966    Quit date: 09/01/2006    Years since quitting: 16.5   Smokeless tobacco: Never   Tobacco comments:    quit 3 years ago  Substance and Sexual Activity   Alcohol use: No   Drug use: No   Sexual activity: Yes    Birth control/protection: Surgical  Other  Topics Concern   Not on file  Social History Narrative   Not on file   Social Determinants of Health   Financial Resource Strain: Not on file  Food Insecurity: Not on file  Transportation Needs: Not on file  Physical Activity: Not on file  Stress: Not on file  Social Connections: Not on file  Intimate Partner Violence: Not on file    Review of Systems: Gen: Denies any fever, chills, cold or flulike send, presyncope, syncope. CV: Denies chest pain, heart palpitations. Resp: Denies shortness of breath, cough. GI: See HPI GU : Denies urinary burning, urinary frequency, urinary hesitancy MS: Denies joint pain. Derm: Denies rash. Psych: Denies depression, anxiety.  Heme: See HPI  Physical Exam: BP 137/81 (BP Location: Right Arm, Patient Position: Sitting, Cuff Size: Large)   Pulse (!) 120   Temp 97.7 F (36.5 C) (Temporal)   Ht 5\' 5"  (1.651 m)   Wt 256 lb 3.2 oz (116.2 kg)   SpO2 98%   BMI 42.63 kg/m  General:   Alert and oriented. Pleasant and cooperative. Well-nourished and well-developed.  Head:  Normocephalic and atraumatic. Eyes:  Without icterus, sclera clear and conjunctiva pink.  Ears:  Normal auditory acuity. Lungs:  Clear to auscultation bilaterally. No wheezes, rales, or rhonchi. No distress.  Heart:  S1, S2 present without murmurs appreciated.  Abdomen:  +BS, soft, obese, moderate tenderness to palpation in the epigastric and RUQ region.  No guarding or rebound. No masses appreciated.  Rectal:  Deferred  Msk:  Symmetrical without gross deformities. Normal posture. Extremities:  Without edema. Neurologic:  Alert and  oriented x4;  grossly normal neurologically. Skin:  Intact without significant lesions or rashes. Psych:  Normal mood and affect.    Assessment:  67 year old female with history of HTN, HLD, hypothyroidism, fibromyalgia, CKD, adenomatous colon polyps, overdue for colonoscopy, IBS mixed type, GERD, chronic abnormal CT of the pancreas noting  extremely atrophic or absent pancreatic tail, chronic abdominal pain, presenting today for  further evaluation of upper abdominal pain, diarrhea, black stool.  Upper abdominal pain/black stool/diarrhea: Reports acute onset epigastric/RUQ abdominal pain about 3 months ago.  Pain is constant, but worsened by eating, now eating primarily toast.  She has also noticed persistent diarrhea with black stools that started at the same time.  Denies NSAIDs, ETOH, recent medication changes, antibiotics.  She does drink filtered well water.  Previously on Dexilant daily but recently started on pantoprazole 40 mg daily per [facility] ER.  She was evaluated in the emergency room on 03/28/2023 for these symptoms.  She had no acute abnormalities on CBC, CMP, or lipase. Hgb was 15. Chest x-ray was negative and CT A/P without contrast with no acute abnormalities.  She has history of cholecystectomy.  Upper abdominal pain and black stool is concerning for gastritis, duodenitis, PUD, H. pylori.  Will need to rule out infectious causes of diarrhea.  Will also screen for celiac disease, check TSH, and CRP/sed rate.  She is overdue for surveillance colonoscopy, but patient would not tolerate colon prep at this time.  Will need to circle back to this in the near future.   Plan:  C. difficile GDH and toxin A/B, GI pathogen panel, TSH, IgA, TTG IgA, ESR, CRP. Stop Dexilant and start pantoprazole 40 mg twice daily. Proceed with upper endoscopy with propofol by Dr. Jena Gauss or Dr. Marletta Lor, whoever has the sooner availability. The risks, benefits, and alternatives have been discussed with the patient in detail. The patient states understanding and desires to proceed.  ASA  3 Continue Creon 2 capsule with meals.  We will need to circle back to colonoscopy once upper GI symptoms have improved.  Follow-up after EGD.  Requested patient to let me know if she has any worsening symptoms between now and the time of her  follow-up.   Ermalinda Memos, PA-C [facility] 03/30/2023

## 2023-03-30 ENCOUNTER — Encounter: Payer: Self-pay | Admitting: Gastroenterology

## 2023-03-30 ENCOUNTER — Ambulatory Visit (INDEPENDENT_AMBULATORY_CARE_PROVIDER_SITE_OTHER): Payer: 59 | Admitting: Gastroenterology

## 2023-03-30 ENCOUNTER — Telehealth: Payer: Self-pay | Admitting: *Deleted

## 2023-03-30 VITALS — BP 137/81 | HR 120 | Temp 97.7°F | Ht 65.0 in | Wt 256.2 lb

## 2023-03-30 DIAGNOSIS — R197 Diarrhea, unspecified: Secondary | ICD-10-CM

## 2023-03-30 DIAGNOSIS — R1010 Upper abdominal pain, unspecified: Secondary | ICD-10-CM

## 2023-03-30 DIAGNOSIS — K921 Melena: Secondary | ICD-10-CM | POA: Diagnosis not present

## 2023-03-30 MED ORDER — PANTOPRAZOLE SODIUM 40 MG PO TBEC: 40.0000 mg | DELAYED_RELEASE_TABLET | Freq: Two times a day (BID) | ORAL | 3 refills | Status: AC

## 2023-03-30 NOTE — Patient Instructions (Addendum)
Please have labs and stool studies completed at [facility].  Stop Dexilant and take pantoprazole to 40 mg twice daily 30 minutes before breakfast and dinner.  We will get you scheduled for an upper endoscopy in the near future to further evaluate your upper abdominal pain.  Continue taking Creon 2 capsules with meals for now.  Will follow-up with you in the office after your procedure.   It was nice to meet you today. I am sorry you are not feeling well. Let me know if you have questions or concerns prior to your next visit.   Ermalinda Memos, PA-C [facility]

## 2023-03-30 NOTE — Telephone Encounter (Signed)
Pt called back and can do 9/18. Aware will call back with pre-op appt.

## 2023-03-30 NOTE — Telephone Encounter (Signed)
LMOVM to call back to offer ?9/18 with Dr. Jena Gauss for EGD, asa 3

## 2023-03-31 LAB — IGA: Immunoglobulin A: 352 mg/dL — ABNORMAL HIGH (ref 70–320)

## 2023-03-31 LAB — C-REACTIVE PROTEIN: CRP: 3.8 mg/L (ref <8.0)

## 2023-03-31 LAB — TSH: TSH: 32.22 m[IU]/L — ABNORMAL HIGH (ref 0.40–4.50)

## 2023-03-31 LAB — TISSUE TRANSGLUTAMINASE, IGA: (tTG) Ab, IgA: <1 U/mL

## 2023-03-31 LAB — SEDIMENTATION RATE: Sed Rate: 6 mm/h (ref 0–30)

## 2023-03-31 NOTE — Telephone Encounter (Signed)
Called pt and she is aware of pre-op appt details

## 2023-04-06 LAB — GIARDIA ANTIGEN
MICRO NUMBER:: 15395769
RESULT:: NOT DETECTED
SPECIMEN QUALITY:: ADEQUATE

## 2023-04-06 LAB — C. DIFFICILE GDH AND TOXIN A/B
GDH ANTIGEN: NOT DETECTED
MICRO NUMBER:: 15396664
SPECIMEN QUALITY:: ADEQUATE
TOXIN A AND B: NOT DETECTED

## 2023-04-06 LAB — GASTROINTESTINAL PATHOGEN PNL
CampyloBacter Group: NOT DETECTED
Norovirus GI/GII: NOT DETECTED
Rotavirus A: NOT DETECTED
Salmonella species: NOT DETECTED
Shiga Toxin 1: NOT DETECTED
Shiga Toxin 2: NOT DETECTED
Shigella Species: NOT DETECTED
Vibrio Group: NOT DETECTED
Yersinia enterocolitica: NOT DETECTED

## 2023-04-06 LAB — CRYPTOSPORIDIUM ANTIGEN, EIA
Specimen Quality:: ADEQUATE
micro Number:: 15404887

## 2023-04-18 NOTE — Patient Instructions (Signed)
Michelle Rich  04/18/2023     @PREFPERIOPPHARMACY @   Your procedure is scheduled on  04/20/2023.   Report to [facility] at  1045 A.M.   Call this number if you have problems the morning of surgery:  [phone]  If you experience any cold or flu symptoms such as cough, fever, chills, shortness of breath, etc. between now and your scheduled surgery, please notify us at the above number.   Remember:  Follow the diet instructions given to you by the office.      Use your inhaler before you come and bring your rescue inhaler with you.     Take these medicines the morning of surgery with A SIP OF WATER        atenolol, prozac, levothyroxine, oxycodone(if needed), pantoprazole, zanaflex (if needed).     Do not wear jewelry, make-up or nail polish, including gel polish,  artificial nails, or any other type of covering on natural nails (fingers and  toes).  Do not wear lotions, powders, or perfumes, or deodorant.  Do not shave 48 hours prior to surgery.  Men may shave face and neck.  Do not bring valuables to the hospital.  [facility] is not responsible for any belongings or valuables.  Contacts, dentures or bridgework may not be worn into surgery.  Leave your suitcase in the car.  After surgery it may be brought to your room.  For patients admitted to the hospital, discharge time will be determined by your treatment team.  Patients discharged the day of surgery will not be allowed to drive home and must have someone with them for 24 hours.    Special instructions:   DO NOT smoke tobacco or vape for 24 hours before your procedure.  Please read over the following fact sheets that you were given. Anesthesia Post-op Instructions and Care and Recovery After Surgery      Upper Endoscopy, Adult, Care After After the procedure, it is common to have a sore throat. It is also common to have: Mild stomach pain or discomfort. Bloating. Nausea. Follow these instructions  at home: The instructions below may help you care for yourself at home. Your health care provider may give you more instructions. If you have questions, ask your health care provider. If you were given a sedative during the procedure, it can affect you for several hours. Do not drive or operate machinery until your health care provider says that it is safe. If you will be going home right after the procedure, plan to have a responsible adult: Take you home from the hospital or clinic. You will not be allowed to drive. Care for you for the time you are told. Follow instructions from your health care provider about what you may eat and drink. Return to your normal activities as told by your health care provider. Ask your health care provider what activities are safe for you. Take over-the-counter and prescription medicines only as told by your health care provider. Contact a health care provider if you: Have a sore throat that lasts longer than one day. Have trouble swallowing. Have a fever. Get help right away if you: Vomit blood or your vomit looks like coffee grounds. Have bloody, black, or tarry stools. Have a very bad sore throat or you cannot swallow. Have difficulty breathing or very bad pain in your chest or abdomen. These symptoms may be an emergency. Get help right away. Call 911. Do not wait  to see if the symptoms will go away. Do not drive yourself to the hospital. Summary After the procedure, it is common to have a sore throat, mild stomach discomfort, bloating, and nausea. If you were given a sedative during the procedure, it can affect you for several hours. Do not drive until your health care provider says that it is safe. Follow instructions from your health care provider about what you may eat and drink. Return to your normal activities as told by your health care provider. This information is not intended to replace advice given to you by your health care provider. Make  sure you discuss any questions you have with your health care provider. Document Revised: 10/28/2021 Document Reviewed: 10/28/2021 Elsevier Patient Education  2024 [employer] Monitored Anesthesia Care, Care After The following information offers guidance on how to care for yourself after your procedure. Your health care provider may also give you more specific instructions. If you have problems or questions, contact your health care provider. What can I expect after the procedure? After the procedure, it is common to have: Tiredness. Little or no memory about what happened during or after the procedure. Impaired judgment when it comes to making decisions. Nausea or vomiting. Some trouble with balance. Follow these instructions at home: For the time period you were told by your health care provider:  Rest. Do not participate in activities where you could fall or become injured. Do not drive or use machinery. Do not drink alcohol. Do not take sleeping pills or medicines that cause drowsiness. Do not make important decisions or sign legal documents. Do not take care of children on your own. Medicines Take over-the-counter and prescription medicines only as told by your health care provider. If you were prescribed antibiotics, take them as told by your health care provider. Do not stop using the antibiotic even if you start to feel better. Eating and drinking Follow instructions from your health care provider about what you may eat and drink. Drink enough fluid to keep your urine pale yellow. If you vomit: Drink clear fluids slowly and in small amounts as you are able. Clear fluids include water, ice chips, low-calorie sports drinks, and fruit juice that has water added to it (diluted fruit juice). Eat light and bland foods in small amounts as you are able. These foods include bananas, applesauce, rice, lean meats, toast, and crackers. General instructions  Have a responsible adult  stay with you for the time you are told. It is important to have someone help care for you until you are awake and alert. If you have sleep apnea, surgery and some medicines can increase your risk for breathing problems. Follow instructions from your health care provider about wearing your sleep device: When you are sleeping. This includes during daytime naps. While taking prescription pain medicines, sleeping medicines, or medicines that make you drowsy. Do not use any products that contain nicotine or tobacco. These products include cigarettes, chewing tobacco, and vaping devices, such as e-cigarettes. If you need help quitting, ask your health care provider. Contact a health care provider if: You feel nauseous or vomit every time you eat or drink. You feel light-headed. You are still sleepy or having trouble with balance after 24 hours. You get a rash. You have a fever. You have redness or swelling around the IV site. Get help right away if: You have trouble breathing. You have new confusion after you get home. These symptoms may be an emergency. Get help right  away. Call 911. Do not wait to see if the symptoms will go away. Do not drive yourself to the hospital. This information is not intended to replace advice given to you by your health care provider. Make sure you discuss any questions you have with your health care provider. Document Revised: 12/14/2021 Document Reviewed: 12/14/2021 Elsevier Patient Education  2024 [employer].

## 2023-04-19 ENCOUNTER — Other Ambulatory Visit: Payer: Self-pay

## 2023-04-19 ENCOUNTER — Encounter (HOSPITAL_COMMUNITY)
Admission: RE | Admit: 2023-04-19 | Discharge: 2023-04-19 | Disposition: A | Discharge status: Home / Self Care | Payer: 59 | Source: Ambulatory Visit | Attending: Internal Medicine | Admitting: Internal Medicine

## 2023-04-19 ENCOUNTER — Encounter (HOSPITAL_COMMUNITY): Payer: Self-pay

## 2023-04-19 VITALS — BP 137/81 | HR 110 | Temp 97.7°F | Resp 18 | Ht 65.0 in | Wt 256.2 lb

## 2023-04-19 DIAGNOSIS — Z01810 Encounter for preprocedural cardiovascular examination: Secondary | ICD-10-CM | POA: Diagnosis present

## 2023-04-19 DIAGNOSIS — R7309 Other abnormal glucose: Secondary | ICD-10-CM | POA: Diagnosis not present

## 2023-04-19 HISTORY — DX: Type 2 diabetes mellitus without complications: E11.9

## 2023-04-19 HISTORY — DX: Depression, unspecified: F32.A

## 2023-04-20 ENCOUNTER — Encounter (HOSPITAL_COMMUNITY): Payer: Self-pay | Admitting: Internal Medicine

## 2023-04-20 ENCOUNTER — Ambulatory Visit (HOSPITAL_COMMUNITY): Payer: 59 | Admitting: Anesthesiology

## 2023-04-20 ENCOUNTER — Ambulatory Visit (HOSPITAL_COMMUNITY)
Admission: RE | Admit: 2023-04-20 | Discharge: 2023-04-20 | Disposition: A | Discharge status: Home / Self Care | Payer: 59 | Attending: Internal Medicine | Admitting: Internal Medicine

## 2023-04-20 ENCOUNTER — Encounter (HOSPITAL_COMMUNITY)
Admission: RE | Disposition: A | Discharge status: Home / Self Care | Payer: Self-pay | Source: Home / Self Care | Attending: Internal Medicine

## 2023-04-20 DIAGNOSIS — E039 Hypothyroidism, unspecified: Secondary | ICD-10-CM

## 2023-04-20 DIAGNOSIS — E785 Hyperlipidemia, unspecified: Secondary | ICD-10-CM | POA: Insufficient documentation

## 2023-04-20 DIAGNOSIS — I129 Hypertensive chronic kidney disease with stage 1 through stage 4 chronic kidney disease, or unspecified chronic kidney disease: Secondary | ICD-10-CM | POA: Insufficient documentation

## 2023-04-20 DIAGNOSIS — K319 Disease of stomach and duodenum, unspecified: Secondary | ICD-10-CM | POA: Diagnosis not present

## 2023-04-20 DIAGNOSIS — Z87891 Personal history of nicotine dependence: Secondary | ICD-10-CM | POA: Insufficient documentation

## 2023-04-20 DIAGNOSIS — K219 Gastro-esophageal reflux disease without esophagitis: Secondary | ICD-10-CM | POA: Diagnosis not present

## 2023-04-20 DIAGNOSIS — Z79899 Other long term (current) drug therapy: Secondary | ICD-10-CM | POA: Insufficient documentation

## 2023-04-20 DIAGNOSIS — N189 Chronic kidney disease, unspecified: Secondary | ICD-10-CM | POA: Insufficient documentation

## 2023-04-20 DIAGNOSIS — I10 Essential (primary) hypertension: Secondary | ICD-10-CM

## 2023-04-20 DIAGNOSIS — Z9049 Acquired absence of other specified parts of digestive tract: Secondary | ICD-10-CM | POA: Insufficient documentation

## 2023-04-20 DIAGNOSIS — Z6841 Body Mass Index (BMI) 40.0 and over, adult: Secondary | ICD-10-CM | POA: Insufficient documentation

## 2023-04-20 DIAGNOSIS — K2970 Gastritis, unspecified, without bleeding: Secondary | ICD-10-CM | POA: Diagnosis not present

## 2023-04-20 DIAGNOSIS — M797 Fibromyalgia: Secondary | ICD-10-CM | POA: Insufficient documentation

## 2023-04-20 DIAGNOSIS — K582 Mixed irritable bowel syndrome: Secondary | ICD-10-CM | POA: Diagnosis not present

## 2023-04-20 DIAGNOSIS — Z86010 Personal history of colonic polyps: Secondary | ICD-10-CM | POA: Insufficient documentation

## 2023-04-20 DIAGNOSIS — R1013 Epigastric pain: Secondary | ICD-10-CM | POA: Diagnosis present

## 2023-04-20 HISTORY — PX: BIOPSY: SHX5522

## 2023-04-20 HISTORY — PX: ESOPHAGOGASTRODUODENOSCOPY (EGD) WITH PROPOFOL: SHX5813

## 2023-04-20 LAB — GLUCOSE, CAPILLARY: Glucose-Capillary: 181 mg/dL — ABNORMAL HIGH (ref 70–99)

## 2023-04-20 SURGERY — ESOPHAGOGASTRODUODENOSCOPY (EGD) WITH PROPOFOL
Anesthesia: General

## 2023-04-20 MED ORDER — LIDOCAINE HCL (CARDIAC) PF 100 MG/5ML IV SOSY
PREFILLED_SYRINGE | INTRAVENOUS | Status: DC | PRN
  Administered 2023-04-20: 50 mg via INTRAVENOUS

## 2023-04-20 MED ORDER — LACTATED RINGERS IV SOLN: INTRAVENOUS | Status: DC

## 2023-04-20 MED ORDER — LACTATED RINGERS IV SOLN: INTRAVENOUS | Status: DC | PRN

## 2023-04-20 MED ORDER — PROPOFOL 10 MG/ML IV BOLUS
INTRAVENOUS | Status: DC | PRN
  Administered 2023-04-20 (×2): 50 mg via INTRAVENOUS

## 2023-04-20 NOTE — Discharge Instructions (Signed)
EGD Discharge instructions Please read the instructions outlined below and refer to this sheet in the next few weeks. These discharge instructions provide you with general information on caring for yourself after you leave the hospital. Your doctor may also give you specific instructions. While your treatment has been planned according to the most current medical practices available, unavoidable complications occasionally occur. If you have any problems or questions after discharge, please call your doctor. ACTIVITY You may resume your regular activity but move at a slower pace for the next 24 hours.  Take frequent rest periods for the next 24 hours.  Walking will help expel (get rid of) the air and reduce the bloated feeling in your abdomen.  No driving for 24 hours (because of the anesthesia (medicine) used during the test).  You may shower.  Do not sign any important legal documents or operate any machinery for 24 hours (because of the anesthesia used during the test).  NUTRITION Drink plenty of fluids.  You may resume your normal diet.  Begin with a light meal and progress to your normal diet.  Avoid alcoholic beverages for 24 hours or as instructed by your caregiver.  MEDICATIONS You may resume your normal medications unless your caregiver tells you otherwise.  WHAT YOU CAN EXPECT TODAY You may experience abdominal discomfort such as a feeling of fullness or "gas" pains.  FOLLOW-UP Your doctor will discuss the results of your test with you.  SEEK IMMEDIATE MEDICAL ATTENTION IF ANY OF THE FOLLOWING OCCUR: Excessive nausea (feeling sick to your stomach) and/or vomiting.  Severe abdominal pain and distention (swelling).  Trouble swallowing.  Temperature over 101 F (37.8 C).  Rectal bleeding or vomiting of blood.        Your stomach appeared inflamed.  Biopsies were taken.    Further recommendations to follow pending review of pathology report  Appointment with Ermalinda Memos in  6 weeks   at patient request I called Lundon Brannum at [phone] -  wrong number

## 2023-04-20 NOTE — Transfer of Care (Signed)
Immediate Anesthesia Transfer of Care Note  Patient: Michelle Rich  Procedure(s) Performed: ESOPHAGOGASTRODUODENOSCOPY (EGD) WITH PROPOFOL BIOPSY  Patient Location: Short Stay  Anesthesia Type:General  Level of Consciousness: awake, alert , oriented, drowsy, and patient cooperative  Airway & Oxygen Therapy: Patient Spontanous Breathing  Post-op Assessment: Report given to RN, Post -op Vital signs reviewed and stable, and Patient moving all extremities X 4  Post vital signs: Reviewed and stable  Last Vitals:  Vitals Value Taken Time  BP 89/41 04/20/23 1250  Temp    Pulse 57 04/20/23 1252  Resp 18 04/20/23 1252  SpO2 94 % 04/20/23 1252  Vitals shown include unfiled device data.  Last Pain:  Vitals:   04/20/23 1233  TempSrc:   PainSc: 0-No pain      Patients Stated Pain Goal: 6 (04/20/23 1107)  Complications: No notable events documented.

## 2023-04-20 NOTE — Anesthesia Preprocedure Evaluation (Signed)
Anesthesia Evaluation  Patient identified by MRN, date of birth, ID band Patient awake    Reviewed: Allergy & Precautions, H&P , NPO status , Patient's Chart, lab work & pertinent test results, reviewed documented beta blocker date and time   Airway Mallampati: II  TM Distance: >3 FB Neck ROM: full    Dental no notable dental hx.    Pulmonary neg pulmonary ROS, shortness of breath, asthma , former smoker   Pulmonary exam normal breath sounds clear to auscultation       Cardiovascular Exercise Tolerance: Good hypertension, negative cardio ROS  Rhythm:regular Rate:Normal     Neuro/Psych  Headaches PSYCHIATRIC DISORDERS  Depression     Neuromuscular disease negative neurological ROS  negative psych ROS   GI/Hepatic negative GI ROS, Neg liver ROS,GERD  ,,  Endo/Other  diabetesHypothyroidism  Morbid obesity  Renal/GU negative Renal ROS  negative genitourinary   Musculoskeletal   Abdominal   Peds  Hematology negative hematology ROS (+)   Anesthesia Other Findings   Reproductive/Obstetrics negative OB ROS                             Anesthesia Physical Anesthesia Plan  ASA: 3  Anesthesia Plan: General   Post-op Pain Management:    Induction:   PONV Risk Score and Plan: Propofol infusion  Airway Management Planned:   Additional Equipment:   Intra-op Plan:   Post-operative Plan:   Informed Consent: I have reviewed the patients History and Physical, chart, labs and discussed the procedure including the risks, benefits and alternatives for the proposed anesthesia with the patient or authorized representative who has indicated his/her understanding and acceptance.     Dental Advisory Given  Plan Discussed with: CRNA  Anesthesia Plan Comments:        Anesthesia Quick Evaluation

## 2023-04-20 NOTE — Op Note (Signed)
[facility] Patient Name: Michelle Rich Procedure Date: 04/20/2023 12:27 PM MRN: [id number] Date of Birth: 1955-12-08 Attending MD: Gennette Pac , MD, [id number] CSN: [id number] Age: 67 Admit Type: Outpatient Procedure:                Upper GI endoscopy Indications:              Dyspepsia Providers:                Gennette Pac, MD, Crystal Page, Zena Amos Referring MD:              Medicines:                Propofol per Anesthesia Complications:            No immediate complications. Estimated Blood Loss:     Estimated blood loss was minimal. Procedure:                Pre-Anesthesia Assessment:                           - Prior to the procedure, a History and Physical                            was performed, and patient medications and                            allergies were reviewed. The patient's tolerance of                            previous anesthesia was also reviewed. The risks                            and benefits of the procedure and the sedation                            options and risks were discussed with the patient.                            All questions were answered, and informed consent                            was obtained. Prior Anticoagulants: The patient has                            taken no anticoagulant or antiplatelet agents. ASA                            Grade Assessment: III - A patient with severe                            systemic disease. After reviewing the risks and  benefits, the patient was deemed in satisfactory                            condition to undergo the procedure.                           After obtaining informed consent, the endoscope was                            passed under direct vision. Throughout the                            procedure, the patient's blood pressure, pulse, and                            oxygen saturations were monitored  continuously. The                            [id number]-H190 ([id number]) scope was introduced through the                            mouth, and advanced to the second part of duodenum.                            The upper GI endoscopy was accomplished without                            difficulty. The patient tolerated the procedure                            well. Scope In: 12:37:51 PM Scope Out: 12:42:40 PM Total Procedure Duration: 0 hours 4 minutes 49 seconds  Findings:      The examined esophagus was normal.      Gastric cavity empty. Intact fundoplication. Diffuse mottling of the       gastric mucosa and patchy erythema. No ulcer or infiltrating process       seen. Patent pylorus.      The duodenal bulb and second portion of the duodenum were normal.       Finally, biopsies the abnormal appearing gastric Koza taken for       histologic study Impression:               - Normal esophagus. Abnormal gastric mucosa of                            uncertain significance?"status post biopsy.                           - Normal duodenal bulb and second portion of the                            duodenum.                           - Moderate Sedation:      Moderate (conscious) sedation was personally  administered by an       anesthesia professional. The following parameters were monitored: oxygen       saturation, heart rate, blood pressure, respiratory rate, EKG, adequacy       of pulmonary ventilation, and response to care. Recommendation:           - Patient has a contact number available for                            emergencies. The signs and symptoms of potential                            delayed complications were discussed with the                            patient. Return to normal activities tomorrow.                            Written discharge instructions were provided to the                            patient.                           - Advance diet as tolerated. Follow-up on                             pathology. Office visit with [place] in 6 weeks Procedure Code(s):        --- Professional ---                           [phone], Esophagogastroduodenoscopy, flexible,                            transoral; diagnostic, including collection of                            specimen(s) by brushing or washing, when performed                            (separate procedure) Diagnosis Code(s):        --- Professional ---                           R10.13, Epigastric pain CPT copyright 2022 American Medical Association. All rights reserved. The codes documented in this report are preliminary and upon coder review may  be revised to meet current compliance requirements. Gerrit Friends. Neng Albee, MD Gennette Pac, MD 04/20/2023 12:49:34 PM This report has been signed electronically. Number of Addenda: 0

## 2023-04-20 NOTE — Interval H&P Note (Signed)
History and Physical Interval Note:  04/20/2023 12:27 PM  [employer]  has presented today for surgery, with the diagnosis of RUQ pain, epigastric pain.  The various methods of treatment have been discussed with the patient and family. After consideration of risks, benefits and other options for treatment, the patient has consented to  Procedure(s) with comments: ESOPHAGOGASTRODUODENOSCOPY (EGD) WITH PROPOFOL (N/A) - 1245pm, asa 3 as a surgical intervention.  The patient's history has been reviewed, patient examined, no change in status, stable for surgery.  I have reviewed the patient's chart and labs.  Questions were answered to the patient's satisfaction.     Raghav Verrilli  No change.  Denies dysphagia.  Diagnostic EGD per plan.  The risks, benefits, limitations, alternatives and imponderables have been reviewed with the patient. Potential for esophageal dilation, biopsy, etc. have also been reviewed.  Questions have been answered. All parties agreeable.

## 2023-04-21 LAB — SURGICAL PATHOLOGY

## 2023-04-23 ENCOUNTER — Encounter: Payer: Self-pay | Admitting: Internal Medicine

## 2023-04-28 ENCOUNTER — Encounter (HOSPITAL_COMMUNITY): Payer: Self-pay | Admitting: Internal Medicine

## 2023-04-28 NOTE — Anesthesia Postprocedure Evaluation (Signed)
Anesthesia Post Note  Patient: Michelle Rich  Procedure(s) Performed: ESOPHAGOGASTRODUODENOSCOPY (EGD) WITH PROPOFOL BIOPSY  Patient location during evaluation: Phase II Anesthesia Type: General Level of consciousness: awake Pain management: pain level controlled Vital Signs Assessment: post-procedure vital signs reviewed and stable Respiratory status: spontaneous breathing and respiratory function stable Cardiovascular status: blood pressure returned to baseline and stable Postop Assessment: no headache and no apparent nausea or vomiting Anesthetic complications: no Comments: Late entry   No notable events documented.   Last Vitals:  Vitals:   04/20/23 1248 04/20/23 1301  BP: (!) 97/44 (!) 110/49  Pulse: (!) 55   Resp: (!) 21   Temp: 36.7 C   SpO2: 94%     Last Pain:  Vitals:   04/20/23 1248  TempSrc: Axillary  PainSc: 0-No pain                 Windell Norfolk

## 2023-05-29 NOTE — Progress Notes (Deleted)
Referring Provider: Renaldo Harrison, DO Primary Care Physician:  Renaldo Harrison, DO Primary GI Physician: Dr. Jena Gauss  No chief complaint on file.   HPI:   Michelle Rich is a 67 y.o. female with history of HTN, HLD, hypothyroidism, fibromyalgia, CKD, adenomatous colon polyps, overdue for colonoscopy, IBS mixed type, GERD, chronic abnormal CT of the pancreas noting extremely atrophic or absent pancreatic tail, chronic abdominal pain, presenting today for follow-up of upper abdominal pain, diarrhea, black stool.  Last seen in our office 03/30/2023.  She reported constant stomach burning. Also with stabbing pain, swelling, knotting sensation in RUQ region as well as some substernal discomfort that was worsened by meals.  Nausea without vomiting.  She had just been transitioned from Dexilant to Protonix 40 mg daily.  Also with diarrhea or loose stool anytime she eats with colors ranging from black to green.  Did not iron or Pepto-Bismol.  Reported 5 or 6 BMs per day as well as nocturnal stools, constant sensation that she needed to have a bowel movement, and not always feeling complete.  She was taking 2 Creon with meals.  Reported all of her symptoms started 3 months ago. Reported prior to this, she would alternate with constipation and diarrhea.  She had just been evaluated in the emergency room on 03/28/2023 for these symptoms.  She had no acute abnormalities on CBC, CMP, or lipase. Hgb was 15. Chest x-ray was negative and CT A/P without contrast with no acute abnormalities.  Recommendations included labs, stool studies, increase pantoprazole to 40 mg twice daily, EGD, continue Creon, circle back to colonoscopy once upper GI symptoms improved.  Labs completed showing TSH elevated at 32.22.  Celiac screen negative.  CRP and sed rate within normal limits.  Stool studies negative including C. difficile, GI pathogen panel, Giardia, Cryptosporidium.  EGD 04/20/2023 with normal esophagus, abnormal gastric  mucosa of uncertain significance biopsied, normal examined duodenum.  Pathology with gastric antral and oxyntic mucosa with changes of reactive gastropathy, negative for H. pylori.  Today:   Past Medical History:  Diagnosis Date   Adenoma of large intestine    "flat"   Asthma    Chronic fatigue syndrome    Constipation    Depression    Diabetes mellitus without complication (HCC)    Diarrhea    Diverticulosis    Fibromyalgia    GERD (gastroesophageal reflux disease)    s/p Nissen   Hypercholesteremia    Hypertension    Hypothyroidism    IBS (irritable bowel syndrome)    Migraines    S/P colonoscopy    2007: internal/external hemorrhoids, otherwise normal by Dr. Murlean Hark. June 2012:normal but biopsy with focal acute colitis, non-specific by Dr. Linna Darner    S/P endoscopy 2009, 2012   normal, small bowel biopsy negative, giardia negative, celiac negative, 2012: reflux esophagitis, benign bx, negative celiac   Shortness of breath     Past Surgical History:  Procedure Laterality Date   ABDOMINAL HYSTERECTOMY     BIOPSY  04/20/2023   Procedure: BIOPSY;  Surgeon: Corbin Ade, MD;  Location: [facility];  Service: Endoscopy;;   CARPAL TUNNEL RELEASE     bilateral x2   CHOLECYSTECTOMY     COLONOSCOPY  09/02/2011   Dr. Terri Skains prep, left-sided diverticulosis, normal terminal ileum, internal hemorrhoids.  Random colon biopsies needed for microscopic colitis but several showed adenomatous changes.     COLONOSCOPY  12/09/2011   Dr. Merlyn Lot diverticulosis, bx= benign colonic mucosa.  Next  colonoscopy 5 years   ELBOW SURGERY     right   ESOPHAGOGASTRODUODENOSCOPY  05/11/2011   Dr. Marny Lowenstein esophageal erosions, intact fundoplication, erythematous small bowel.  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   steel rods and buttons for abdominal wound     TONSILLECTOMY     TRIGGER FINGER RELEASE     bilateral   TUBAL LIGATION     Umbilical Herniorrhapy      Current Outpatient Medications  Medication Sig Dispense Refill   atenolol (TENORMIN) 25 MG tablet Take 25 mg by mouth daily.     Cholecalciferol 50 MCG (2000 UT) TABS Take 1 tablet by mouth daily.     FLUoxetine (PROZAC) 20 MG capsule Take 60 mg by mouth daily.      fluticasone (FLONASE) 50 MCG/ACT nasal spray Place 2 sprays into both nostrils daily.     Fluticasone-Salmeterol (ADVAIR DISKUS) 250-50 MCG/DOSE AEPB Inhale 1 puff into the lungs daily.      Insulin Glargine (TOUJEO SOLOSTAR ) Inject 1.5 mg into the skin once.     levothyroxine (SYNTHROID, LEVOTHROID) 88 MCG tablet Take 150 mcg by mouth daily.      omega-3 acid ethyl esters (LOVAZA) 1 G capsule Take 2 g by mouth 2 (two) times daily.       Pancrelipase, Lip-Prot-Amyl, (CREON) 36000 UNITS CPEP Take 2 capsules by mouth 3 (three) times daily with meals. Take 1 with snacks. (Patient taking differently: Take 2 capsules by mouth 2 (two) times daily. Take 1 with snacks.) 240 capsule 5   pantoprazole (PROTONIX) 40 MG tablet Take 1 tablet (40 mg total) by mouth 2 (two) times daily. 60 tablet 3   promethazine (PHENERGAN) 25 MG tablet Take 1 tablet (25 mg total) by mouth every 6 (six) hours as needed. for nausea/vomiting (Patient taking differently: Take 50 mg by mouth every 6 (six) hours as needed. for nausea/vomiting) 20 tablet 0   telmisartan-hydrochlorothiazide (MICARDIS HCT) 80-12.5 MG per tablet Take 1 tablet by mouth daily.       No current facility-administered medications for this visit.    Allergies as of 06/01/2023 - Review Complete 04/20/2023  Allergen Reaction Noted    Other  04/27/2011   Amitriptyline Rash 02/24/2011   Aspirin Rash 02/24/2011   Codeine Rash    Morphine and codeine Rash 02/24/2011   Nsaids Rash 04/27/2011   Penicillins Rash    Propoxyphene n-acetaminophen Rash    Sulfa antibiotics Rash 02/24/2011    Family History  Problem Relation Age of Onset   Colon cancer Maternal Grandfather        deceased age 3   Ulcerative colitis Father        ? question of UC   Anesthesia problems Neg Hx    Hypotension Neg Hx    Malignant hyperthermia Neg Hx    Pseudochol deficiency Neg Hx     Social History   Socioeconomic History   Marital status: Legally Separated    Spouse name: Not on file   Number of children: Not on file   Years of education:  Not on file   Highest education level: Not on file  Occupational History   Not on file  Tobacco Use   Smoking status: Former    Current packs/day: 0.00    Average packs/day: 1 pack/day for 40.0 years (40.0 ttl pk-yrs)    Types: Cigarettes    Start date: 09/01/1966    Quit date: 09/01/2006    Years since quitting: 16.7   Smokeless tobacco: Never   Tobacco comments:    quit 3 years ago  Substance and Sexual Activity   Alcohol use: No   Drug use: No   Sexual activity: Yes    Birth control/protection: Surgical  Other Topics Concern   Not on file  Social History Narrative   Not on file   Social Determinants of Health   Financial Resource Strain: Not on file  Food Insecurity: Not on file  Transportation Needs: Not on file  Physical Activity: Not on file  Stress: Not on file  Social Connections: Not on file    Review of Systems: Gen: Denies fever, chills, anorexia. Denies fatigue, weakness, weight loss.  CV: Denies chest pain, palpitations, syncope, peripheral edema, and claudication. Resp: Denies dyspnea at rest, cough, wheezing, coughing up blood, and pleurisy. GI: Denies vomiting blood, jaundice, and fecal incontinence.   Denies dysphagia or odynophagia. Derm: Denies rash,  itching, dry skin Psych: Denies depression, anxiety, memory loss, confusion. No homicidal or suicidal ideation.  Heme: Denies bruising, bleeding, and enlarged lymph nodes.  Physical Exam: There were no vitals taken for this visit. General:   Alert and oriented. No distress noted. Pleasant and cooperative.  Head:  Normocephalic and atraumatic. Eyes:  Conjuctiva clear without scleral icterus. Heart:  S1, S2 present without murmurs appreciated. Lungs:  Clear to auscultation bilaterally. No wheezes, rales, or rhonchi. No distress.  Abdomen:  +BS, soft, non-tender and non-distended. No rebound or guarding. No HSM or masses noted. Msk:  Symmetrical without gross deformities. Normal posture. Extremities:  Without edema. Neurologic:  Alert and  oriented x4 Psych:  Normal mood and affect.    Assessment:     Plan:  ***   Ermalinda Memos, PA-C [facility] 06/01/2023

## 2023-06-01 ENCOUNTER — Ambulatory Visit: Payer: 59 | Admitting: Gastroenterology

## 2023-06-03 ENCOUNTER — Encounter: Payer: Self-pay | Admitting: *Deleted

## 2024-01-10 ENCOUNTER — Ambulatory Visit: Admitting: Internal Medicine
